# Patient Record
Sex: Female | Born: 1970 | Race: White | Hispanic: No | Marital: Married | Smoking: Former smoker
Health system: Southern US, Community
[De-identification: ages and names within clinical notes are randomized; demographics above are authoritative.]

## PROBLEM LIST (undated history)

## (undated) DIAGNOSIS — E079 Disorder of thyroid, unspecified: Secondary | ICD-10-CM

## (undated) DIAGNOSIS — F419 Anxiety disorder, unspecified: Secondary | ICD-10-CM

## (undated) DIAGNOSIS — F329 Major depressive disorder, single episode, unspecified: Secondary | ICD-10-CM

## (undated) DIAGNOSIS — B001 Herpesviral vesicular dermatitis: Secondary | ICD-10-CM

## (undated) DIAGNOSIS — F32A Depression, unspecified: Secondary | ICD-10-CM

## (undated) HISTORY — DX: Disorder of thyroid, unspecified: E07.9

## (undated) HISTORY — DX: Depression, unspecified: F32.A

## (undated) HISTORY — DX: Anxiety disorder, unspecified: F41.9

## (undated) HISTORY — DX: Major depressive disorder, single episode, unspecified: F32.9

## (undated) HISTORY — DX: Herpesviral vesicular dermatitis: B00.1

---

## 2004-04-14 ENCOUNTER — Emergency Department (HOSPITAL_COMMUNITY): Admission: EM | Admit: 2004-04-14 | Discharge: 2004-04-14 | Payer: Self-pay | Admitting: Emergency Medicine

## 2013-01-17 ENCOUNTER — Other Ambulatory Visit: Payer: Self-pay | Admitting: Nurse Practitioner

## 2013-01-18 NOTE — Telephone Encounter (Signed)
LAST OV 4/13

## 2013-02-22 ENCOUNTER — Other Ambulatory Visit: Payer: Self-pay | Admitting: Nurse Practitioner

## 2013-02-24 ENCOUNTER — Encounter: Payer: Self-pay | Admitting: Nurse Practitioner

## 2013-02-24 ENCOUNTER — Ambulatory Visit (INDEPENDENT_AMBULATORY_CARE_PROVIDER_SITE_OTHER): Payer: Managed Care, Other (non HMO) | Admitting: Nurse Practitioner

## 2013-02-24 VITALS — BP 115/75 | HR 76 | Temp 97.7°F | Ht 65.0 in | Wt 130.0 lb

## 2013-02-24 DIAGNOSIS — F329 Major depressive disorder, single episode, unspecified: Secondary | ICD-10-CM

## 2013-02-24 DIAGNOSIS — F32A Depression, unspecified: Secondary | ICD-10-CM | POA: Insufficient documentation

## 2013-02-24 DIAGNOSIS — B009 Herpesviral infection, unspecified: Secondary | ICD-10-CM | POA: Insufficient documentation

## 2013-02-24 MED ORDER — SERTRALINE HCL 50 MG PO TABS: 50.0000 mg | ORAL_TABLET | Freq: Every day | ORAL | Status: DC

## 2013-02-24 MED ORDER — VALACYCLOVIR HCL 1 G PO TABS: ORAL_TABLET | ORAL | Status: DC

## 2013-02-24 NOTE — Progress Notes (Signed)
  Subjective:    Patient ID: Shari Knox, female    DOB: 1971/05/12, 42 y.o.   MRN: [id number]  HPI Patient here today for follow-up of: 1. Depression- Currently on Zoloft 50mg  1 po qd- Patient says she is doing well- Can tell when she forgets to take meds. Patient says it calm she nerves and keeps her from getting overly anxious. 2. HSv1- Patient says that she gets fever blisters at least monthly- Valtrex relives them quickly.    Review of Systems  All other systems reviewed and are negative.       Objective:   Physical Exam  Constitutional: She is oriented to person, place, and time. She appears well-developed and well-nourished.  HENT:  Nose: Nose normal.  Mouth/Throat: Oropharynx is clear and moist.  Eyes: EOM are normal.  Neck: Trachea normal, normal range of motion and full passive range of motion without pain. Neck supple. No JVD present. Carotid bruit is not present. No thyromegaly present.  Cardiovascular: Normal rate, regular rhythm, normal heart sounds and intact distal pulses.  Exam reveals no gallop and no friction rub.   No murmur heard. Pulmonary/Chest: Effort normal and breath sounds normal.  Abdominal: Soft. Bowel sounds are normal. She exhibits no distension and no mass. There is no tenderness.  Musculoskeletal: Normal range of motion.  Lymphadenopathy:    She has no cervical adenopathy.  Neurological: She is alert and oriented to person, place, and time. She has normal reflexes.  Skin: Skin is warm and dry.  Psychiatric: She has a normal mood and affect. Her behavior is normal. Judgment and thought content normal.    BP 115/75  Pulse 76  Temp(Src) 97.7 F (36.5 C) (Oral)  Ht 5\' 5"  (1.651 m)  Wt 130 lb (58.968 kg)  BMI 21.63 kg/m2  LMP 02/21/2013       Assessment & Plan:  1. Depression Stress management Exercise - sertraline (ZOLOFT) 50 MG tablet; Take 1 tablet (50 mg total) by mouth daily.  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  Dispense: 12 tablet; Refill: 1  Mary-Margaret Daphine Deutscher, FNP

## 2013-05-27 ENCOUNTER — Ambulatory Visit: Payer: Managed Care, Other (non HMO) | Admitting: Nurse Practitioner

## 2013-09-08 ENCOUNTER — Other Ambulatory Visit: Payer: Self-pay | Admitting: Nurse Practitioner

## 2013-11-16 ENCOUNTER — Other Ambulatory Visit: Payer: Self-pay | Admitting: Nurse Practitioner

## 2013-11-17 NOTE — Telephone Encounter (Signed)
Last seen 02/24/13

## 2013-12-13 ENCOUNTER — Encounter: Payer: Self-pay | Admitting: Nurse Practitioner

## 2013-12-13 ENCOUNTER — Ambulatory Visit (INDEPENDENT_AMBULATORY_CARE_PROVIDER_SITE_OTHER): Payer: Managed Care, Other (non HMO) | Admitting: Nurse Practitioner

## 2013-12-13 VITALS — BP 121/78 | HR 94 | Temp 96.9°F | Ht 65.0 in | Wt 132.0 lb

## 2013-12-13 DIAGNOSIS — Z0000 Encounter for general adult medical examination without abnormal findings: Secondary | ICD-10-CM

## 2013-12-13 DIAGNOSIS — Z124 Encounter for screening for malignant neoplasm of cervix: Secondary | ICD-10-CM

## 2013-12-13 DIAGNOSIS — Z01419 Encounter for gynecological examination (general) (routine) without abnormal findings: Secondary | ICD-10-CM

## 2013-12-13 MED ORDER — SERTRALINE HCL 50 MG PO TABS: ORAL_TABLET | ORAL | Status: DC

## 2013-12-13 MED ORDER — VALACYCLOVIR HCL 1 G PO TABS: ORAL_TABLET | ORAL | Status: DC

## 2013-12-13 MED ORDER — CLOTRIMAZOLE-BETAMETHASONE 1-0.05 % EX CREA: 1.0000 "application " | TOPICAL_CREAM | Freq: Two times a day (BID) | CUTANEOUS | Status: DC

## 2013-12-13 NOTE — Patient Instructions (Signed)

## 2013-12-13 NOTE — Progress Notes (Signed)
Subjective:    Patient ID: Shari Knox, female    DOB: [birth year], 43 y.o.   MRN: [id number]  HPI Patient presents today for annual exam. States she is doing well and has no current complaints. Currently taking Zoloft 50 mg 1 tab PO daily. Has not had any recent episodes of anxiety. Also takes Valtrex when she feels blisters coming on. Occurs about once every 2 months. Patient does not exercise. Last dental exam was 12/14 and has not had a recent eye exam.   Rash Located on back of neck and began around January that itches at times. Had similar rash 3 years ago which was scraped and determined to be a yeast infection. Was given a cream to use which resolved rash. Patient has since ran out of the cream.    Review of Systems  Constitutional: Negative for fatigue.  Respiratory: Negative for shortness of breath.   Cardiovascular: Negative for chest pain.  Skin: Positive for rash (Neck).  Neurological: Negative for dizziness.  Psychiatric/Behavioral: Negative for sleep disturbance. The patient is not nervous/anxious.   All other systems reviewed and are negative.       Objective:   Physical Exam  Constitutional: She is oriented to person, place, and time. She appears well-developed and well-nourished.  HENT:  Head: Normocephalic.  Right Ear: Hearing, tympanic membrane, external ear and ear canal normal.  Left Ear: Hearing, tympanic membrane, external ear and ear canal normal.  Nose: Nose normal.  Mouth/Throat: Uvula is midline and oropharynx is clear and moist.  Eyes: Conjunctivae and EOM are normal. Pupils are equal, round, and reactive to light.  Neck: Normal range of motion and full passive range of motion without pain. Neck supple. No JVD present. Carotid bruit is not present. No mass and no thyromegaly present.  Cardiovascular: Normal rate, normal heart sounds and intact distal pulses.   No murmur heard. Pulmonary/Chest: Effort normal and breath sounds normal.  Abdominal: Soft.  Bowel sounds are normal. She exhibits no mass. There is no tenderness.  Genitourinary: Vagina normal and uterus normal. No breast swelling, tenderness, discharge or bleeding.  bimanual exam-No adnexal masses or tenderness.  LMP - 11/19/13  Musculoskeletal: Normal range of motion.  Lymphadenopathy:    She has no cervical adenopathy.  Neurological: She is alert and oriented to person, place, and time.  Skin: Skin is warm and dry. Rash noted.  Scaly, erythematous lesion with irregular boarders  Psychiatric: She has a normal mood and affect. Her behavior is normal. Judgment and thought content normal.   BP 121/78  Pulse 94  Temp(Src) 96.9 F (36.1 C) (Oral)  Ht _0  (1.651 m)  Wt 132 lb (59.875 kg)  BMI 21.97 kg/m2        Assessment & Plan:   1. Annual physical exam    Orders Placed This Encounter  Procedures  . CMP14+EGFR  . Lipid panel  . TSH  . CBC With differential/Platelet   Meds ordered this encounter  Medications  . clotrimazole-betamethasone (LOTRISONE) cream    Sig: Apply 1 application topically 2 (two) times daily.    Dispense:  45 g    Refill:  1    Order Specific Question:  Supervising Provider    Answer:  Chipper Herb [[id number]]  . sertraline (ZOLOFT) 50 MG tablet    Sig: TAKE 1 TABLET DAILY    Dispense:  30 tablet    Refill:  5    Order Specific Question:  Supervising Provider  Answer:  Chipper Herb [[id number]]  . valACYclovir (VALTREX) 1000 MG tablet    Sig: TAKE 2 TABLETS TWICE DAILY AT ONSET OF FEVER BLISTER    Dispense:  12 tablet    Refill:  1    Order Specific Question:  Supervising Provider    Answer:  Joycelyn Man   Mammogram to be scheduled by patient Diet and exercise encouraged Health maintenance reviewed Labs pending F/U in 1 year  Mary-Margaret Hassell Done, FNP

## 2013-12-14 ENCOUNTER — Other Ambulatory Visit: Payer: Self-pay | Admitting: Nurse Practitioner

## 2013-12-14 LAB — CBC WITH DIFFERENTIAL
BASOS ABS: 0 10*3/uL (ref 0.0–0.2)
BASOS: 1 %
EOS ABS: 0.3 10*3/uL (ref 0.0–0.4)
Eos: 5 %
HEMATOCRIT: 38.5 % (ref 34.0–46.6)
Hemoglobin: 12.5 g/dL (ref 11.1–15.9)
IMMATURE GRANULOCYTES: 0 %
Immature Grans (Abs): 0 10*3/uL (ref 0.0–0.1)
Lymphocytes Absolute: 1.3 10*3/uL (ref 0.7–3.1)
Lymphs: 19 %
MCH: 29.1 pg (ref 26.6–33.0)
MCHC: 32.5 g/dL (ref 31.5–35.7)
MCV: 90 fL (ref 79–97)
MONOCYTES: 7 %
MONOS ABS: 0.5 10*3/uL (ref 0.1–0.9)
NEUTROS ABS: 5 10*3/uL (ref 1.4–7.0)
NEUTROS PCT: 68 %
Platelets: 272 10*3/uL (ref 150–379)
RBC: 4.29 x10E6/uL (ref 3.77–5.28)
RDW: 13.7 % (ref 12.3–15.4)
WBC: 7.2 10*3/uL (ref 3.4–10.8)

## 2013-12-14 LAB — CMP14+EGFR
A/G RATIO: 1.6 (ref 1.1–2.5)
ALBUMIN: 4.5 g/dL (ref 3.5–5.5)
ALT: 11 IU/L (ref 0–32)
AST: 20 IU/L (ref 0–40)
Alkaline Phosphatase: 58 IU/L (ref 39–117)
BILIRUBIN TOTAL: 0.3 mg/dL (ref 0.0–1.2)
BUN / CREAT RATIO: 20 (ref 9–23)
BUN: 13 mg/dL (ref 6–24)
CALCIUM: 9.2 mg/dL (ref 8.7–10.2)
CHLORIDE: 100 mmol/L (ref 97–108)
CO2: 25 mmol/L (ref 18–29)
CREATININE: 0.64 mg/dL (ref 0.57–1.00)
GFR calc Af Amer: 127 mL/min/{1.73_m2} (ref >59)
GFR, EST NON AFRICAN AMERICAN: 110 mL/min/{1.73_m2} (ref >59)
GLOBULIN, TOTAL: 2.8 g/dL (ref 1.5–4.5)
Glucose: 85 mg/dL (ref 65–99)
POTASSIUM: 4.3 mmol/L (ref 3.5–5.2)
SODIUM: 139 mmol/L (ref 134–144)
TOTAL PROTEIN: 7.3 g/dL (ref 6.0–8.5)

## 2013-12-14 LAB — LIPID PANEL
CHOL/HDL RATIO: 2.8 ratio (ref 0.0–4.4)
Cholesterol, Total: 146 mg/dL (ref 100–199)
HDL: 53 mg/dL (ref >39)
LDL Calculated: 79 mg/dL (ref 0–99)
Triglycerides: 68 mg/dL (ref 0–149)
VLDL Cholesterol Cal: 14 mg/dL (ref 5–40)

## 2013-12-14 LAB — TSH: TSH: 5.7 u[IU]/mL — AB (ref 0.450–4.500)

## 2013-12-14 MED ORDER — LEVOTHYROXINE SODIUM 50 MCG PO TABS: 50.0000 ug | ORAL_TABLET | Freq: Every day | ORAL | Status: DC

## 2013-12-16 ENCOUNTER — Telehealth: Payer: Self-pay | Admitting: Nurse Practitioner

## 2013-12-16 LAB — PAP IG W/ RFLX HPV ASCU: PAP Smear Comment: 0

## 2013-12-16 LAB — HPV DNA PROBE HIGH RISK, AMPLIFIED: HPV, high-risk: NEGATIVE

## 2013-12-16 NOTE — Telephone Encounter (Signed)
Spoke with patient and given lab result and told patient we will readdress her pap

## 2013-12-16 NOTE — Telephone Encounter (Signed)
Discussed lab results and recommendations.

## 2013-12-19 ENCOUNTER — Telehealth: Payer: Self-pay | Admitting: Nurse Practitioner

## 2013-12-19 NOTE — Telephone Encounter (Signed)
Patient is very concerned because she has never had thyroid problems. And her thyroid was high and the thyroid med you sent in is to raise the leveal per patient she is just very confused because she cant get a straight answer.  Please call patient and explain to her what is going on.

## 2013-12-19 NOTE — Telephone Encounter (Signed)
Contacted patient and explained TSH levels and hypothyroidism

## 2014-03-22 ENCOUNTER — Other Ambulatory Visit: Payer: Self-pay | Admitting: Nurse Practitioner

## 2014-04-21 ENCOUNTER — Other Ambulatory Visit: Payer: Self-pay | Admitting: Nurse Practitioner

## 2014-05-23 ENCOUNTER — Other Ambulatory Visit: Payer: Self-pay | Admitting: Nurse Practitioner

## 2014-05-25 NOTE — Telephone Encounter (Signed)
no more refills without being seen  

## 2014-05-25 NOTE — Telephone Encounter (Signed)
Patient last seen on 12/13/13. Was to return in 6 weeks for repeat labs. Please advise on refill

## 2014-06-13 ENCOUNTER — Telehealth: Payer: Self-pay | Admitting: Nurse Practitioner

## 2014-06-13 NOTE — Telephone Encounter (Signed)
Appt given for next week per patient request

## 2014-06-21 ENCOUNTER — Telehealth: Payer: Self-pay | Admitting: *Deleted

## 2014-06-21 ENCOUNTER — Encounter: Payer: Self-pay | Admitting: Nurse Practitioner

## 2014-06-21 ENCOUNTER — Ambulatory Visit (INDEPENDENT_AMBULATORY_CARE_PROVIDER_SITE_OTHER): Payer: Managed Care, Other (non HMO) | Admitting: Nurse Practitioner

## 2014-06-21 VITALS — BP 107/67 | HR 72 | Temp 97.5°F | Ht 65.5 in | Wt 132.6 lb

## 2014-06-21 DIAGNOSIS — E034 Atrophy of thyroid (acquired): Secondary | ICD-10-CM

## 2014-06-21 DIAGNOSIS — E039 Hypothyroidism, unspecified: Secondary | ICD-10-CM | POA: Insufficient documentation

## 2014-06-21 DIAGNOSIS — F32A Depression, unspecified: Secondary | ICD-10-CM

## 2014-06-21 DIAGNOSIS — F3289 Other specified depressive episodes: Secondary | ICD-10-CM

## 2014-06-21 DIAGNOSIS — E038 Other specified hypothyroidism: Secondary | ICD-10-CM

## 2014-06-21 DIAGNOSIS — E0789 Other specified disorders of thyroid: Secondary | ICD-10-CM

## 2014-06-21 DIAGNOSIS — F329 Major depressive disorder, single episode, unspecified: Secondary | ICD-10-CM

## 2014-06-21 DIAGNOSIS — B009 Herpesviral infection, unspecified: Secondary | ICD-10-CM

## 2014-06-21 MED ORDER — VALACYCLOVIR HCL 1 G PO TABS: ORAL_TABLET | ORAL | Status: DC

## 2014-06-21 MED ORDER — SERTRALINE HCL 50 MG PO TABS: ORAL_TABLET | ORAL | Status: DC

## 2014-06-21 MED ORDER — LEVOTHYROXINE SODIUM 50 MCG PO TABS: ORAL_TABLET | ORAL | Status: DC

## 2014-06-21 NOTE — Progress Notes (Signed)
   Subjective:    Patient ID: Shari Knox, female    DOB: [birth year], 43 y.o.   MRN: [id number]  HPI Patient is in today for follow up of chronic medical problems. - Depression Currenty on zoloft- doing well without side effects -Hypothyroidism Levothyroxine 15mcg - no c/o fatigue- she says that she is feeling well. -HSV Fever blisters- takes valtrex only when sh efeels one coming on.  Review of Systems  Constitutional: Negative.   HENT: Negative.   Respiratory: Negative.   Cardiovascular: Negative.   Genitourinary: Negative.   Neurological: Negative.   Psychiatric/Behavioral: Negative.   All other systems reviewed and are negative.      Objective:   Physical Exam  Constitutional: She is oriented to person, place, and time. She appears well-developed and well-nourished.  HENT:  Nose: Nose normal.  Mouth/Throat: Oropharynx is clear and moist.  Eyes: EOM are normal.  Neck: Trachea normal, normal range of motion and full passive range of motion without pain. Neck supple. No JVD present. Carotid bruit is not present. No thyromegaly present.  Cardiovascular: Normal rate, regular rhythm, normal heart sounds and intact distal pulses.  Exam reveals no gallop and no friction rub.   No murmur heard. Pulmonary/Chest: Effort normal and breath sounds normal.  Abdominal: Soft. Bowel sounds are normal. She exhibits no distension and no mass. There is no tenderness.  Musculoskeletal: Normal range of motion.  Lymphadenopathy:    She has no cervical adenopathy.  Neurological: She is alert and oriented to person, place, and time. She has normal reflexes.  Skin: Skin is warm and dry.  Psychiatric: She has a normal mood and affect. Her behavior is normal. Judgment and thought content normal.   BP 107/67  Pulse 72  Temp(Src) 97.5 F (36.4 C) (Oral)  Ht 5' 5.5" (1.664 m)  Wt 132 lb 9.6 oz (60.147 kg)  BMI 21.72 kg/m2        Assessment & Plan:  1. Hypothyroidism due to acquired atrophy  of thyroid - levothyroxine (SYNTHROID, LEVOTHROID) 50 MCG tablet; Take 1 tablet (50 mcg total) by mouth daily before breakfast.  Dispense: 30 tablet; Refill: 5 - CMP14+EGFR - NMR, lipoprofile - Thyroid Panel With TSH  2. HSV-1 (herpes simplex virus 1) infection  - valACYclovir (VALTREX) 1000 MG tablet; TAKE 2 TABLETS TWICE DAILY AT ONSET OF FEVER BLISTER  Dispense: 12 tablet; Refill: 3  3. Depression Stress management - sertraline (ZOLOFT) 50 MG tablet; TAKE 1 TABLET DAILY  Dispense: 30 tablet; Refill: 5  Reviewed health maintenance Diet and exercise encouraged Follow up in 3 moths  Mary-Margaret Hassell Done, FNP

## 2014-06-21 NOTE — Telephone Encounter (Signed)
Called patient to come in on 06-22-14 to have blood work drawn since she didn't get it with appointment.

## 2014-06-21 NOTE — Patient Instructions (Signed)

## 2014-06-22 ENCOUNTER — Other Ambulatory Visit: Payer: Managed Care, Other (non HMO)

## 2014-06-22 NOTE — Progress Notes (Signed)
LAB ONLY 

## 2014-06-23 LAB — CMP14+EGFR
A/G RATIO: 1.6 (ref 1.1–2.5)
ALBUMIN: 4.4 g/dL (ref 3.5–5.5)
ALT: 13 IU/L (ref 0–32)
AST: 18 IU/L (ref 0–40)
Alkaline Phosphatase: 66 IU/L (ref 39–117)
BILIRUBIN TOTAL: 0.2 mg/dL (ref 0.0–1.2)
BUN / CREAT RATIO: 11 (ref 9–23)
BUN: 8 mg/dL (ref 6–24)
CO2: 24 mmol/L (ref 18–29)
CREATININE: 0.7 mg/dL (ref 0.57–1.00)
Calcium: 9.2 mg/dL (ref 8.7–10.2)
Chloride: 101 mmol/L (ref 97–108)
GFR, EST AFRICAN AMERICAN: 123 mL/min/{1.73_m2} (ref >59)
GFR, EST NON AFRICAN AMERICAN: 106 mL/min/{1.73_m2} (ref >59)
GLOBULIN, TOTAL: 2.7 g/dL (ref 1.5–4.5)
Glucose: 92 mg/dL (ref 65–99)
POTASSIUM: 3.9 mmol/L (ref 3.5–5.2)
Sodium: 139 mmol/L (ref 134–144)
Total Protein: 7.1 g/dL (ref 6.0–8.5)

## 2014-06-23 LAB — NMR, LIPOPROFILE
Cholesterol: 146 mg/dL (ref 100–199)
HDL CHOLESTEROL BY NMR: 48 mg/dL (ref >39)
HDL Particle Number: 33.7 umol/L (ref >=30.5)
LDL Particle Number: 705 nmol/L (ref <1000)
LDL SIZE: 21.2 nm (ref >20.5)
LDLC SERPL CALC-MCNC: 79 mg/dL (ref 0–99)
LP-IR Score: 42 (ref <=45)
Small LDL Particle Number: <90 nmol/L (ref <=527)
Triglycerides by NMR: 97 mg/dL (ref 0–149)

## 2014-06-23 LAB — THYROID PANEL WITH TSH
Free Thyroxine Index: 2.2 (ref 1.2–4.9)
T3 UPTAKE RATIO: 30 % (ref 24–39)
T4 TOTAL: 7.2 ug/dL (ref 4.5–12.0)
TSH: 2.47 u[IU]/mL (ref 0.450–4.500)

## 2014-12-22 ENCOUNTER — Telehealth: Payer: Self-pay | Admitting: Nurse Practitioner

## 2014-12-22 NOTE — Telephone Encounter (Signed)
Pt given appt with MMM 3/21 at 9:45.

## 2014-12-25 ENCOUNTER — Ambulatory Visit (INDEPENDENT_AMBULATORY_CARE_PROVIDER_SITE_OTHER): Payer: Managed Care, Other (non HMO) | Admitting: Nurse Practitioner

## 2014-12-25 ENCOUNTER — Encounter: Payer: Self-pay | Admitting: Nurse Practitioner

## 2014-12-25 VITALS — BP 132/85 | HR 91 | Temp 97.4°F | Ht 65.0 in | Wt 130.0 lb

## 2014-12-25 DIAGNOSIS — B009 Herpesviral infection, unspecified: Secondary | ICD-10-CM | POA: Diagnosis not present

## 2014-12-25 DIAGNOSIS — F329 Major depressive disorder, single episode, unspecified: Secondary | ICD-10-CM | POA: Diagnosis not present

## 2014-12-25 DIAGNOSIS — E034 Atrophy of thyroid (acquired): Secondary | ICD-10-CM

## 2014-12-25 DIAGNOSIS — E038 Other specified hypothyroidism: Secondary | ICD-10-CM

## 2014-12-25 DIAGNOSIS — F32A Depression, unspecified: Secondary | ICD-10-CM

## 2014-12-25 MED ORDER — SERTRALINE HCL 100 MG PO TABS: 100.0000 mg | ORAL_TABLET | Freq: Every day | ORAL | Status: DC

## 2014-12-25 MED ORDER — LEVOTHYROXINE SODIUM 50 MCG PO TABS: ORAL_TABLET | ORAL | Status: DC

## 2014-12-25 MED ORDER — ALPRAZOLAM 0.25 MG PO TABS: 0.2500 mg | ORAL_TABLET | Freq: Two times a day (BID) | ORAL | Status: DC | PRN
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
## 2014-12-25 NOTE — Patient Instructions (Signed)
Generalized Anxiety Disorder Generalized anxiety disorder (GAD) is a mental disorder. It interferes with life functions, including relationships, work, and school. GAD is different from normal anxiety, which everyone experiences at some point in their lives in response to specific life events and activities. Normal anxiety actually helps us prepare for and get through these life events and activities. Normal anxiety goes away after the event or activity is over.  GAD causes anxiety that is not necessarily related to specific events or activities. It also causes excess anxiety in proportion to specific events or activities. The anxiety associated with GAD is also difficult to control. GAD can vary from mild to severe. People with severe GAD can have intense waves of anxiety with physical symptoms (panic attacks).  SYMPTOMS The anxiety and worry associated with GAD are difficult to control. This anxiety and worry are related to many life events and activities and also occur more days than not for 6 months or longer. People with GAD also have three or more of the following symptoms (one or more in children):  Restlessness.   Fatigue.  Difficulty concentrating.   Irritability.  Muscle tension.  Difficulty sleeping or unsatisfying sleep. DIAGNOSIS GAD is diagnosed through an assessment by your health care provider. Your health care provider will ask you questions aboutyour mood,physical symptoms, and events in your life. Your health care provider may ask you about your medical history and use of alcohol or drugs, including prescription medicines. Your health care provider may also do a physical exam and blood tests. Certain medical conditions and the use of certain substances can cause symptoms similar to those associated with GAD. Your health care provider may refer you to a mental health specialist for further evaluation. TREATMENT The following therapies are usually used to treat GAD:    Medication. Antidepressant medication usually is prescribed for long-term daily control. Antianxiety medicines may be added in severe cases, especially when panic attacks occur.   Talk therapy (psychotherapy). Certain types of talk therapy can be helpful in treating GAD by providing support, education, and guidance. A form of talk therapy called cognitive behavioral therapy can teach you healthy ways to think about and react to daily life events and activities.  Stress managementtechniques. These include yoga, meditation, and exercise and can be very helpful when they are practiced regularly. A mental health specialist can help determine which treatment is best for you. Some people see improvement with one therapy. However, other people require a combination of therapies. Document Released: 01/17/2013 Document Revised: 02/06/2014 Document Reviewed: 01/17/2013 Exit[employer] Patient Information 2015 [employer]. This information is not intended to replace advice given to you by your health care provider. Make sure you discuss any questions you have with your health care provider.  

## 2014-12-26 LAB — THYROID PANEL WITH TSH
FREE THYROXINE INDEX: 2.1 (ref 1.2–4.9)
T3 UPTAKE RATIO: 25 % (ref 24–39)
T4, Total: 8.2 ug/dL (ref 4.5–12.0)
TSH: 2.62 u[IU]/mL (ref 0.450–4.500)

## 2014-12-26 LAB — CMP14+EGFR
A/G RATIO: 1.5 (ref 1.1–2.5)
ALBUMIN: 4.6 g/dL (ref 3.5–5.5)
ALK PHOS: 71 IU/L (ref 39–117)
ALT: 13 IU/L (ref 0–32)
AST: 19 IU/L (ref 0–40)
BILIRUBIN TOTAL: 0.3 mg/dL (ref 0.0–1.2)
BUN/Creatinine Ratio: 9 (ref 9–23)
BUN: 5 mg/dL — AB (ref 6–24)
CO2: 26 mmol/L (ref 18–29)
CREATININE: 0.58 mg/dL (ref 0.57–1.00)
Calcium: 9.5 mg/dL (ref 8.7–10.2)
Chloride: 99 mmol/L (ref 97–108)
GFR calc non Af Amer: 113 mL/min/{1.73_m2} (ref >59)
GFR, EST AFRICAN AMERICAN: 131 mL/min/{1.73_m2} (ref >59)
GLUCOSE: 87 mg/dL (ref 65–99)
Globulin, Total: 3.1 g/dL (ref 1.5–4.5)
POTASSIUM: 4.4 mmol/L (ref 3.5–5.2)
Sodium: 140 mmol/L (ref 134–144)
TOTAL PROTEIN: 7.7 g/dL (ref 6.0–8.5)

## 2014-12-27 ENCOUNTER — Encounter: Payer: Self-pay | Admitting: Nurse Practitioner

## 2015-01-02 ENCOUNTER — Telehealth: Payer: Self-pay | Admitting: Nurse Practitioner

## 2015-01-03 NOTE — Telephone Encounter (Signed)
Patient notified that paperwork up front and ready to pick up

## 2015-01-18 ENCOUNTER — Encounter: Payer: Self-pay | Admitting: Nurse Practitioner

## 2015-01-18 ENCOUNTER — Ambulatory Visit (INDEPENDENT_AMBULATORY_CARE_PROVIDER_SITE_OTHER): Payer: Managed Care, Other (non HMO) | Admitting: Nurse Practitioner

## 2015-01-18 VITALS — BP 118/74 | HR 77 | Temp 97.0°F | Ht 65.0 in | Wt 130.0 lb

## 2015-01-18 DIAGNOSIS — F329 Major depressive disorder, single episode, unspecified: Secondary | ICD-10-CM

## 2015-01-18 DIAGNOSIS — F411 Generalized anxiety disorder: Secondary | ICD-10-CM | POA: Diagnosis not present

## 2015-01-18 DIAGNOSIS — F32A Depression, unspecified: Secondary | ICD-10-CM

## 2015-01-18 NOTE — Patient Instructions (Signed)
Stress and Stress Management Stress is a normal reaction to life events. It is what you feel when life demands more than you are used to or more than you can handle. Some stress can be useful. For example, the stress reaction can help you catch the last bus of the day, study for a test, or meet a deadline at work. But stress that occurs too often or for too long can cause problems. It can affect your emotional health and interfere with relationships and normal daily activities. Too much stress can weaken your immune system and increase your risk for physical illness. If you already have a medical problem, stress can make it worse. CAUSES  All sorts of life events may cause stress. An event that causes stress for one person may not be stressful for another person. Major life events commonly cause stress. These may be positive or negative. Examples include losing your job, moving into a new home, getting married, having a baby, or losing a loved one. Less obvious life events may also cause stress, especially if they occur day after day or in combination. Examples include working long hours, driving in traffic, caring for children, being in debt, or being in a difficult relationship. SIGNS AND SYMPTOMS Stress may cause emotional symptoms including, the following:  Anxiety. This is feeling worried, afraid, on edge, overwhelmed, or out of control.  Anger. This is feeling irritated or impatient.  Depression. This is feeling sad, down, helpless, or guilty.  Difficulty focusing, remembering, or making decisions. Stress may cause physical symptoms, including the following:   Aches and pains. These may affect your head, neck, back, stomach, or other areas of your body.  Tight muscles or clenched jaw.  Low energy or trouble sleeping. Stress may cause unhealthy behaviors, including the following:   Eating to feel better (overeating) or skipping meals.  Sleeping too little, too much, or both.  Working  too much or putting off tasks (procrastination).  Smoking, drinking alcohol, or using drugs to feel better. DIAGNOSIS  Stress is diagnosed through an assessment by your health care provider. Your health care provider will ask questions about your symptoms and any stressful life events.Your health care provider will also ask about your medical history and may order blood tests or other tests. Certain medical conditions and medicine can cause physical symptoms similar to stress. Mental illness can cause emotional symptoms and unhealthy behaviors similar to stress. Your health care provider may refer you to a mental health professional for further evaluation.  TREATMENT  Stress management is the recommended treatment for stress.The goals of stress management are reducing stressful life events and coping with stress in healthy ways.  Techniques for reducing stressful life events include the following:  Stress identification. Self-monitor for stress and identify what causes stress for you. These skills may help you to avoid some stressful events.  Time management. Set your priorities, keep a calendar of events, and learn to say "no." These tools can help you avoid making too many commitments. Techniques for coping with stress include the following:  Rethinking the problem. Try to think realistically about stressful events rather than ignoring them or overreacting. Try to find the positives in a stressful situation rather than focusing on the negatives.  Exercise. Physical exercise can release both physical and emotional tension. The key is to find a form of exercise you enjoy and do it regularly.  Relaxation techniques. These relax the body and mind. Examples include yoga, meditation, tai chi, biofeedback, deep  breathing, progressive muscle relaxation, listening to music, being out in nature, journaling, and other hobbies. Again, the key is to find one or more that you enjoy and can do  regularly.  Healthy lifestyle. Eat a balanced diet, get plenty of sleep, and do not smoke. Avoid using alcohol or drugs to relax.  Strong support network. Spend time with family, friends, or other people you enjoy being around.Express your feelings and talk things over with someone you trust. Counseling or talktherapy with a mental health professional may be helpful if you are having difficulty managing stress on your own. Medicine is typically not recommended for the treatment of stress.Talk to your health care provider if you think you need medicine for symptoms of stress. HOME CARE INSTRUCTIONS  Keep all follow-up visits as directed by your health care provider.  Take all medicines as directed by your health care provider. SEEK MEDICAL CARE IF:  Your symptoms get worse or you start having new symptoms.  You feel overwhelmed by your problems and can no longer manage them on your own. SEEK IMMEDIATE MEDICAL CARE IF:  You feel like hurting yourself or someone else. Document Released: 03/18/2001 Document Revised: 02/06/2014 Document Reviewed: 05/17/2013 Exit[employer] Patient Information 2015 [employer]. This information is not intended to replace advice given to you by your health care provider. Make sure you discuss any questions you have with your health care provider.  

## 2015-01-18 NOTE — Progress Notes (Signed)
   Subjective:    Patient ID: Shari CooleyKaren Knox, female    DOB: 17-Jun-1971, 44 y.o.   MRN: [id number]  HPI Patient in today for follow-up appointment- She was seen 12/25/14 with increasing anxiety- we increased her zoloft at that time and gave her low dose xanax to take on prn basis- SHe says she has only needed to take xanax on 2 occassions. She says she is doing much better without side effects from zoloft.    Review of Systems  Constitutional: Negative.   HENT: Negative.   Respiratory: Negative.   Cardiovascular: Negative.   Genitourinary: Negative.   Neurological: Negative.   Psychiatric/Behavioral: Negative.   All other systems reviewed and are negative.      Objective:   Physical Exam  Constitutional: She appears well-developed and well-nourished.  Cardiovascular: Normal rate, regular rhythm and normal heart sounds.   Pulmonary/Chest: Effort normal and breath sounds normal.  Skin: Skin is warm.  Psychiatric: She has a normal mood and affect. Her behavior is normal. Judgment and thought content normal.    BP 118/74 mmHg  Pulse 77  Temp(Src) 97 F (36.1 C) (Oral)  Ht 5\' 5"  (1.651 m)  Wt 130 lb (58.968 kg)  BMI 21.63 kg/m2       Assessment & Plan:   1. Depression   2. GAD (generalized anxiety disorder)    Continue current  meds Stress management RTO prn  Mary-Margaret Daphine DeutscherMartin, FNP

## 2015-01-25 ENCOUNTER — Telehealth: Payer: Self-pay | Admitting: Nurse Practitioner

## 2015-01-30 NOTE — Telephone Encounter (Signed)
Been trying to fax since 01/19/15, fax fails, pt aware and coming to pick up forms today

## 2015-02-28 ENCOUNTER — Telehealth: Payer: Self-pay | Admitting: Nurse Practitioner

## 2015-02-28 NOTE — Telephone Encounter (Signed)
Patient advised to bring her FMLA paper work by the office to fill out

## 2015-03-26 ENCOUNTER — Encounter (INDEPENDENT_AMBULATORY_CARE_PROVIDER_SITE_OTHER): Payer: Self-pay

## 2015-03-26 ENCOUNTER — Encounter: Payer: Self-pay | Admitting: Nurse Practitioner

## 2015-03-26 ENCOUNTER — Ambulatory Visit (INDEPENDENT_AMBULATORY_CARE_PROVIDER_SITE_OTHER): Payer: Managed Care, Other (non HMO) | Admitting: Nurse Practitioner

## 2015-03-26 VITALS — BP 106/68 | HR 85 | Temp 97.1°F | Ht 65.0 in | Wt 129.0 lb

## 2015-03-26 DIAGNOSIS — F32A Depression, unspecified: Secondary | ICD-10-CM

## 2015-03-26 DIAGNOSIS — F411 Generalized anxiety disorder: Secondary | ICD-10-CM

## 2015-03-26 DIAGNOSIS — F329 Major depressive disorder, single episode, unspecified: Secondary | ICD-10-CM

## 2015-03-26 MED ORDER — SERTRALINE HCL 100 MG PO TABS: 100.0000 mg | ORAL_TABLET | Freq: Every day | ORAL | Status: DC

## 2015-03-26 NOTE — Progress Notes (Signed)
   Subjective:    Patient ID: Shari Knox, female    DOB: Aug 16, 1971, 44 y.o.   MRN: [id number]  HPI Patient has been out of work for several months due to anxiety- she was started on zoloft and was given xanax- she is doing much better- has  Not needed xanax in the last several weeks. Zoloft is working well without complications or side effects. SHe is ready to go back to work and needs  Note to return without restrictions.    Review of Systems  Constitutional: Negative.   HENT: Negative.   Respiratory: Negative.   Cardiovascular: Negative.   Genitourinary: Negative.   Neurological: Negative.   Psychiatric/Behavioral: Negative.   All other systems reviewed and are negative.      Objective:   Physical Exam  Constitutional: She is oriented to person, place, and time. She appears well-developed and well-nourished.  Cardiovascular: Normal rate, regular rhythm and normal heart sounds.   Pulmonary/Chest: Effort normal and breath sounds normal.  Neurological: She is alert and oriented to person, place, and time.  Skin: Skin is warm.  Psychiatric: She has a normal mood and affect. Her behavior is normal. Judgment and thought content normal.    BP 106/68 mmHg  Pulse 85  Temp(Src) 97.1 F (36.2 C) (Oral)  Ht 5\' 5"  (1.651 m)  Wt 129 lb (58.514 kg)  BMI 21.47 kg/m2       Assessment & Plan:   1. GAD (generalized anxiety disorder)   2. Depression    Meds ordered this encounter  Medications  . sertraline (ZOLOFT) 100 MG tablet    Sig: Take 1 tablet (100 mg total) by mouth daily.    Dispense:  30 tablet    Refill:  5    Order Specific Question:  Supervising Provider    Answer:  Deborra Medina   Stress management Return to work without restrictions  Mary-Margaret Daphine Deutscher, FNP

## 2015-03-26 NOTE — Patient Instructions (Signed)
Stress and Stress Management Stress is a normal reaction to life events. It is what you feel when life demands more than you are used to or more than you can handle. Some stress can be useful. For example, the stress reaction can help you catch the last bus of the day, study for a test, or meet a deadline at work. But stress that occurs too often or for too long can cause problems. It can affect your emotional health and interfere with relationships and normal daily activities. Too much stress can weaken your immune system and increase your risk for physical illness. If you already have a medical problem, stress can make it worse. CAUSES  All sorts of life events may cause stress. An event that causes stress for one person may not be stressful for another person. Major life events commonly cause stress. These may be positive or negative. Examples include losing your job, moving into a new home, getting married, having a baby, or losing a loved one. Less obvious life events may also cause stress, especially if they occur day after day or in combination. Examples include working long hours, driving in traffic, caring for children, being in debt, or being in a difficult relationship. SIGNS AND SYMPTOMS Stress may cause emotional symptoms including, the following:  Anxiety. This is feeling worried, afraid, on edge, overwhelmed, or out of control.  Anger. This is feeling irritated or impatient.  Depression. This is feeling sad, down, helpless, or guilty.  Difficulty focusing, remembering, or making decisions. Stress may cause physical symptoms, including the following:   Aches and pains. These may affect your head, neck, back, stomach, or other areas of your body.  Tight muscles or clenched jaw.  Low energy or trouble sleeping. Stress may cause unhealthy behaviors, including the following:   Eating to feel better (overeating) or skipping meals.  Sleeping too little, too much, or both.  Working  too much or putting off tasks (procrastination).  Smoking, drinking alcohol, or using drugs to feel better. DIAGNOSIS  Stress is diagnosed through an assessment by your health care provider. Your health care provider will ask questions about your symptoms and any stressful life events.Your health care provider will also ask about your medical history and may order blood tests or other tests. Certain medical conditions and medicine can cause physical symptoms similar to stress. Mental illness can cause emotional symptoms and unhealthy behaviors similar to stress. Your health care provider may refer you to a mental health professional for further evaluation.  TREATMENT  Stress management is the recommended treatment for stress.The goals of stress management are reducing stressful life events and coping with stress in healthy ways.  Techniques for reducing stressful life events include the following:  Stress identification. Self-monitor for stress and identify what causes stress for you. These skills may help you to avoid some stressful events.  Time management. Set your priorities, keep a calendar of events, and learn to say "no." These tools can help you avoid making too many commitments. Techniques for coping with stress include the following:  Rethinking the problem. Try to think realistically about stressful events rather than ignoring them or overreacting. Try to find the positives in a stressful situation rather than focusing on the negatives.  Exercise. Physical exercise can release both physical and emotional tension. The key is to find a form of exercise you enjoy and do it regularly.  Relaxation techniques. These relax the body and mind. Examples include yoga, meditation, tai chi, biofeedback, deep  breathing, progressive muscle relaxation, listening to music, being out in nature, journaling, and other hobbies. Again, the key is to find one or more that you enjoy and can do  regularly.  Healthy lifestyle. Eat a balanced diet, get plenty of sleep, and do not smoke. Avoid using alcohol or drugs to relax.  Strong support network. Spend time with family, friends, or other people you enjoy being around.Express your feelings and talk things over with someone you trust. Counseling or talktherapy with a mental health professional may be helpful if you are having difficulty managing stress on your own. Medicine is typically not recommended for the treatment of stress.Talk to your health care provider if you think you need medicine for symptoms of stress. HOME CARE INSTRUCTIONS  Keep all follow-up visits as directed by your health care provider.  Take all medicines as directed by your health care provider. SEEK MEDICAL CARE IF:  Your symptoms get worse or you start having new symptoms.  You feel overwhelmed by your problems and can no longer manage them on your own. SEEK IMMEDIATE MEDICAL CARE IF:  You feel like hurting yourself or someone else. Document Released: 03/18/2001 Document Revised: 02/06/2014 Document Reviewed: 05/17/2013 Exit[employer] Patient Information 2015 [employer]. This information is not intended to replace advice given to you by your health care provider. Make sure you discuss any questions you have with your health care provider.  

## 2015-06-29 ENCOUNTER — Other Ambulatory Visit: Payer: Self-pay | Admitting: Nurse Practitioner

## 2015-10-11 ENCOUNTER — Encounter: Payer: Self-pay | Admitting: Family

## 2015-10-11 ENCOUNTER — Ambulatory Visit (INDEPENDENT_AMBULATORY_CARE_PROVIDER_SITE_OTHER): Payer: Managed Care, Other (non HMO) | Admitting: Family

## 2015-10-11 ENCOUNTER — Ambulatory Visit: Payer: Managed Care, Other (non HMO) | Admitting: Family

## 2015-10-11 VITALS — BP 123/78 | HR 81 | Temp 97.7°F | Ht 65.0 in | Wt 131.2 lb

## 2015-10-11 DIAGNOSIS — G43009 Migraine without aura, not intractable, without status migrainosus: Secondary | ICD-10-CM

## 2015-10-11 MED ORDER — SUMATRIPTAN SUCCINATE 25 MG PO TABS: 25.0000 mg | ORAL_TABLET | ORAL | Status: DC | PRN

## 2015-10-11 MED ORDER — KETOROLAC TROMETHAMINE 60 MG/2ML IM SOLN
60.0000 mg | Freq: Once | INTRAMUSCULAR | Status: AC
  Administered 2015-10-11: 60 mg via INTRAMUSCULAR

## 2015-10-11 MED ORDER — ONDANSETRON HCL 4 MG PO TABS: 4.0000 mg | ORAL_TABLET | Freq: Three times a day (TID) | ORAL | Status: DC | PRN

## 2015-10-11 NOTE — Patient Instructions (Signed)

## 2015-10-11 NOTE — Progress Notes (Signed)
Subjective:    Patient ID: Shari CooleyKaren Knox, female    DOB: 09-19-1971, 45 y.o.   MRN: [id number]  Headache  This is a new problem. The current episode started in the past 7 days. The problem occurs constantly. The problem has been waxing and waning. The pain is located in the left unilateral region. The pain radiates to the left neck. The quality of the pain is described as throbbing. The pain is at a severity of 9/10. The pain is moderate. Associated symptoms include blurred vision, dizziness, nausea, phonophobia and vomiting. Pertinent negatives include no ear pain, eye pain, eye redness, hearing loss, photophobia, sinus pressure, sore throat or tinnitus. Nothing aggravates the symptoms. She has tried acetaminophen, darkened room, cold packs, Excedrin and NSAIDs for the symptoms. The treatment provided mild relief. Her past medical history is significant for migraine headaches. There is no history of migraines in the family.      Review of Systems  Constitutional: Negative.   HENT: Negative for ear pain, hearing loss, sinus pressure, sore throat and tinnitus.   Eyes: Positive for blurred vision. Negative for photophobia, pain and redness.  Respiratory: Negative.  Negative for shortness of breath.   Cardiovascular: Negative.  Negative for palpitations.  Gastrointestinal: Positive for nausea and vomiting.  Endocrine: Negative.   Genitourinary: Negative.   Musculoskeletal: Negative.   Neurological: Positive for dizziness and headaches.  Hematological: Negative.   Psychiatric/Behavioral: Negative.   All other systems reviewed and are negative.      Objective:   Physical Exam  Constitutional: She is oriented to person, place, and time. She appears well-developed and well-nourished. No distress.  HENT:  Head: Normocephalic and atraumatic.  Right Ear: External ear normal.  Mouth/Throat: Oropharynx is clear and moist.  Eyes: Pupils are equal, round, and reactive to light.  Neck: Normal  range of motion. Neck supple. No thyromegaly present.  Cardiovascular: Normal rate, regular rhythm, normal heart sounds and intact distal pulses.   No murmur heard. Pulmonary/Chest: Effort normal and breath sounds normal. No respiratory distress. She has no wheezes.  Abdominal: Soft. Bowel sounds are normal. She exhibits no distension. There is no tenderness.  Musculoskeletal: Normal range of motion. She exhibits no edema or tenderness.  Neurological: She is alert and oriented to person, place, and time. She has normal reflexes. No cranial nerve deficit.  Skin: Skin is warm and dry.  Psychiatric: She has a normal mood and affect. Her behavior is normal. Judgment and thought content normal.  Vitals reviewed.     BP 123/78 mmHg  Pulse 81  Temp(Src) 97.7 F (36.5 C) (Oral)  Ht 5\' 5"  (1.651 m)  Wt 131 lb 3.2 oz (59.512 kg)  BMI 21.83 kg/m2  LMP 10/09/2014     Assessment & Plan:  1. Migraine without aura and without status migrainosus, not intractable -Get plenty of rest -Limit stress  -IF headache worsens or vision, speech, or gait changes go straight to ED -RTO in 1 week  - ketorolac (TORADOL) injection 60 mg; Inject 2 mLs (60 mg total) into the muscle once. - SUMAtriptan (IMITREX) 25 MG tablet; Take 1 tablet (25 mg total) by mouth every 2 (two) hours as needed for migraine. May repeat in 2 hours if headache persists or recurs.  Dispense: 10 tablet; Refill: 0 - ondansetron (ZOFRAN) 4 MG tablet; Take 1 tablet (4 mg total) by mouth every 8 (eight) hours as needed for nausea or vomiting.  Dispense: 20 tablet; Refill: 0  Jannifer Rodneyhristy Rajean Desantiago, FNP

## 2015-10-15 ENCOUNTER — Other Ambulatory Visit: Payer: Self-pay | Admitting: Family

## 2015-10-16 ENCOUNTER — Ambulatory Visit (HOSPITAL_COMMUNITY)
Admission: RE | Admit: 2015-10-16 | Discharge: 2015-10-16 | Disposition: A | Discharge status: Home / Self Care | Payer: Managed Care, Other (non HMO) | Source: Ambulatory Visit | Attending: Family | Admitting: Family

## 2015-10-16 ENCOUNTER — Ambulatory Visit (INDEPENDENT_AMBULATORY_CARE_PROVIDER_SITE_OTHER): Payer: Managed Care, Other (non HMO) | Admitting: Family

## 2015-10-16 ENCOUNTER — Encounter: Payer: Self-pay | Admitting: Family

## 2015-10-16 VITALS — BP 115/87 | HR 115 | Temp 97.3°F | Ht 65.0 in | Wt 130.6 lb

## 2015-10-16 DIAGNOSIS — R51 Headache: Secondary | ICD-10-CM

## 2015-10-16 DIAGNOSIS — R519 Headache, unspecified: Secondary | ICD-10-CM

## 2015-10-16 DIAGNOSIS — H538 Other visual disturbances: Secondary | ICD-10-CM | POA: Diagnosis not present

## 2015-10-16 NOTE — Patient Instructions (Signed)

## 2015-10-16 NOTE — Progress Notes (Signed)
   Subjective:    Patient ID: Shari Knox, female    DOB: 07/16/1971, 45 y.o.   MRN: [id number]  HPI Pt presents to the office today for a 1 week recheck headache for the last 10 days. PT states she continues to have a headache, vision changes, and neck pain. PT states she has constant achy 8 out 10 pain in the base of her skull. Pt states her vision is blurry and pt states she "feels heavy". PT was given Toradol, Imitrex, and Zofran. PT states the nausea is better with the zofran, but continues to be dizzy. PT states this headache is different than previous headaches.    Review of Systems  Constitutional: Negative.   HENT: Negative.   Eyes: Negative.   Respiratory: Negative.  Negative for shortness of breath.   Cardiovascular: Negative.  Negative for palpitations.  Gastrointestinal: Negative.   Endocrine: Negative.   Genitourinary: Negative.   Musculoskeletal: Negative.   Neurological: Negative.  Negative for headaches.  Hematological: Negative.   Psychiatric/Behavioral: Negative.   All other systems reviewed and are negative.      Objective:   Physical Exam  Constitutional: She is oriented to person, place, and time. She appears well-developed and well-nourished. No distress.  HENT:  Head: Normocephalic and atraumatic.  Eyes: Pupils are equal, round, and reactive to light.  Neck: Normal range of motion. Neck supple. No thyromegaly present.  Cardiovascular: Normal rate, regular rhythm, normal heart sounds and intact distal pulses.   No murmur heard. Pulmonary/Chest: Effort normal and breath sounds normal. No respiratory distress. She has no wheezes.  Abdominal: Soft. Bowel sounds are normal. She exhibits no distension. There is no tenderness.  Musculoskeletal: Normal range of motion. She exhibits no edema or tenderness.  Neurological: She is alert and oriented to person, place, and time. She has normal reflexes. No cranial nerve deficit.  Skin: Skin is warm and dry.    Psychiatric: She has a normal mood and affect. Her behavior is normal. Judgment and thought content normal.  Vitals reviewed.   Blood pressure 115/87, pulse 115, temperature 97.3 F (36.3 C), temperature source Oral, height _0  (1.651 m), weight 130 lb 9.6 oz (59.24 kg), last menstrual period 10/09/2014.       Assessment & Plan:  1. Acute intractable headache, unspecified headache type - CT Head Wo Contrast; Future - Anemia Profile B - CMP14+EGFR  2. Blurred vision - CT Head Wo Contrast; Future - Anemia Profile B - CMP14+EGFR  Labs pending CT scan pending If CT scan WNL will refer to neurologists    Evelina Dun, FNP

## 2015-10-17 ENCOUNTER — Other Ambulatory Visit: Payer: Self-pay | Admitting: Family

## 2015-10-17 LAB — ANEMIA PROFILE B
BASOS: 1 %
Basophils Absolute: 0 10*3/uL (ref 0.0–0.2)
EOS (ABSOLUTE): 0.3 10*3/uL (ref 0.0–0.4)
EOS: 5 %
FERRITIN: 15 ng/mL (ref 15–150)
Folate: 19.1 ng/mL (ref >3.0)
HEMATOCRIT: 36.9 % (ref 34.0–46.6)
HEMOGLOBIN: 12.1 g/dL (ref 11.1–15.9)
IMMATURE GRANS (ABS): 0 10*3/uL (ref 0.0–0.1)
IMMATURE GRANULOCYTES: 0 %
IRON SATURATION: 25 % (ref 15–55)
IRON: 86 ug/dL (ref 27–159)
LYMPHS: 20 %
Lymphocytes Absolute: 1.1 10*3/uL (ref 0.7–3.1)
MCH: 28.8 pg (ref 26.6–33.0)
MCHC: 32.8 g/dL (ref 31.5–35.7)
MCV: 88 fL (ref 79–97)
MONOCYTES: 8 %
MONOS ABS: 0.5 10*3/uL (ref 0.1–0.9)
NEUTROS PCT: 66 %
Neutrophils Absolute: 3.6 10*3/uL (ref 1.4–7.0)
Platelets: 346 10*3/uL (ref 150–379)
RBC: 4.2 x10E6/uL (ref 3.77–5.28)
RDW: 14.1 % (ref 12.3–15.4)
RETIC CT PCT: 0.8 % (ref 0.6–2.6)
TIBC: 350 ug/dL (ref 250–450)
UIBC: 264 ug/dL (ref 131–425)
Vitamin B-12: 568 pg/mL (ref 211–946)
WBC: 5.5 10*3/uL (ref 3.4–10.8)

## 2015-10-17 LAB — CMP14+EGFR
A/G RATIO: 1.5 (ref 1.1–2.5)
ALT: 12 IU/L (ref 0–32)
AST: 20 IU/L (ref 0–40)
Albumin: 4.2 g/dL (ref 3.5–5.5)
Alkaline Phosphatase: 58 IU/L (ref 39–117)
BUN/Creatinine Ratio: 15 (ref 9–23)
BUN: 8 mg/dL (ref 6–24)
Bilirubin Total: <0.2 mg/dL (ref 0.0–1.2)
CALCIUM: 8.8 mg/dL (ref 8.7–10.2)
CO2: 24 mmol/L (ref 18–29)
CREATININE: 0.55 mg/dL — AB (ref 0.57–1.00)
Chloride: 99 mmol/L (ref 96–106)
GFR calc Af Amer: 132 mL/min/{1.73_m2} (ref >59)
GFR, EST NON AFRICAN AMERICAN: 114 mL/min/{1.73_m2} (ref >59)
GLOBULIN, TOTAL: 2.8 g/dL (ref 1.5–4.5)
Glucose: 91 mg/dL (ref 65–99)
Potassium: 4 mmol/L (ref 3.5–5.2)
SODIUM: 140 mmol/L (ref 134–144)
TOTAL PROTEIN: 7 g/dL (ref 6.0–8.5)

## 2015-10-17 MED ORDER — TOPIRAMATE 25 MG PO TABS: 25.0000 mg | ORAL_TABLET | Freq: Two times a day (BID) | ORAL | Status: DC

## 2015-10-18 ENCOUNTER — Ambulatory Visit: Payer: Managed Care, Other (non HMO) | Admitting: Family

## 2015-10-18 ENCOUNTER — Telehealth: Payer: Self-pay | Admitting: Nurse Practitioner

## 2015-10-18 NOTE — Telephone Encounter (Signed)
Pt called

## 2015-10-29 ENCOUNTER — Ambulatory Visit: Payer: Managed Care, Other (non HMO) | Admitting: Family

## 2015-11-06 ENCOUNTER — Other Ambulatory Visit: Payer: Self-pay | Admitting: Nurse Practitioner

## 2015-12-31 ENCOUNTER — Other Ambulatory Visit: Payer: Self-pay | Admitting: Nurse Practitioner

## 2015-12-31 NOTE — Telephone Encounter (Signed)
Last seen 10/16/15 Shari BonitoChristy  Last thyroid level 12/25/14

## 2016-01-24 ENCOUNTER — Ambulatory Visit: Payer: Managed Care, Other (non HMO) | Admitting: Family

## 2016-01-29 ENCOUNTER — Encounter: Payer: Self-pay | Admitting: *Deleted

## 2016-01-29 ENCOUNTER — Ambulatory Visit (INDEPENDENT_AMBULATORY_CARE_PROVIDER_SITE_OTHER): Payer: Managed Care, Other (non HMO) | Admitting: Nurse Practitioner

## 2016-01-29 ENCOUNTER — Encounter: Payer: Self-pay | Admitting: Nurse Practitioner

## 2016-01-29 ENCOUNTER — Encounter (INDEPENDENT_AMBULATORY_CARE_PROVIDER_SITE_OTHER): Payer: Self-pay

## 2016-01-29 VITALS — BP 117/78 | HR 90 | Temp 97.4°F | Ht 65.0 in | Wt 130.0 lb

## 2016-01-29 DIAGNOSIS — F329 Major depressive disorder, single episode, unspecified: Secondary | ICD-10-CM

## 2016-01-29 DIAGNOSIS — E038 Other specified hypothyroidism: Secondary | ICD-10-CM

## 2016-01-29 DIAGNOSIS — F32A Depression, unspecified: Secondary | ICD-10-CM

## 2016-01-29 DIAGNOSIS — F411 Generalized anxiety disorder: Secondary | ICD-10-CM

## 2016-01-29 DIAGNOSIS — E034 Atrophy of thyroid (acquired): Secondary | ICD-10-CM | POA: Diagnosis not present

## 2016-01-29 DIAGNOSIS — B009 Herpesviral infection, unspecified: Secondary | ICD-10-CM | POA: Diagnosis not present

## 2016-01-29 MED ORDER — LEVOTHYROXINE SODIUM 50 MCG PO TABS: ORAL_TABLET | ORAL | Status: DC

## 2016-01-29 MED ORDER — VALACYCLOVIR HCL 1 G PO TABS: ORAL_TABLET | ORAL | Status: DC

## 2016-01-29 NOTE — Patient Instructions (Signed)
Stress and Stress Management Stress is a normal reaction to life events. It is what you feel when life demands more than you are used to or more than you can handle. Some stress can be useful. For example, the stress reaction can help you catch the last bus of the day, study for a test, or meet a deadline at work. But stress that occurs too often or for too long can cause problems. It can affect your emotional health and interfere with relationships and normal daily activities. Too much stress can weaken your immune system and increase your risk for physical illness. If you already have a medical problem, stress can make it worse. CAUSES  All sorts of life events may cause stress. An event that causes stress for one person may not be stressful for another person. Major life events commonly cause stress. These may be positive or negative. Examples include losing your job, moving into a new home, getting married, having a baby, or losing a loved one. Less obvious life events may also cause stress, especially if they occur day after day or in combination. Examples include working long hours, driving in traffic, caring for children, being in debt, or being in a difficult relationship. SIGNS AND SYMPTOMS Stress may cause emotional symptoms including, the following:  Anxiety. This is feeling worried, afraid, on edge, overwhelmed, or out of control.  Anger. This is feeling irritated or impatient.  Depression. This is feeling sad, down, helpless, or guilty.  Difficulty focusing, remembering, or making decisions. Stress may cause physical symptoms, including the following:   Aches and pains. These may affect your head, neck, back, stomach, or other areas of your body.  Tight muscles or clenched jaw.  Low energy or trouble sleeping. Stress may cause unhealthy behaviors, including the following:   Eating to feel better (overeating) or skipping meals.  Sleeping too little, too much, or both.  Working  too much or putting off tasks (procrastination).  Smoking, drinking alcohol, or using drugs to feel better. DIAGNOSIS  Stress is diagnosed through an assessment by your health care provider. Your health care provider will ask questions about your symptoms and any stressful life events.Your health care provider will also ask about your medical history and may order blood tests or other tests. Certain medical conditions and medicine can cause physical symptoms similar to stress. Mental illness can cause emotional symptoms and unhealthy behaviors similar to stress. Your health care provider may refer you to a mental health professional for further evaluation.  TREATMENT  Stress management is the recommended treatment for stress.The goals of stress management are reducing stressful life events and coping with stress in healthy ways.  Techniques for reducing stressful life events include the following:  Stress identification. Self-monitor for stress and identify what causes stress for you. These skills may help you to avoid some stressful events.  Time management. Set your priorities, keep a calendar of events, and learn to say "no." These tools can help you avoid making too many commitments. Techniques for coping with stress include the following:  Rethinking the problem. Try to think realistically about stressful events rather than ignoring them or overreacting. Try to find the positives in a stressful situation rather than focusing on the negatives.  Exercise. Physical exercise can release both physical and emotional tension. The key is to find a form of exercise you enjoy and do it regularly.  Relaxation techniques. These relax the body and mind. Examples include yoga, meditation, tai chi, biofeedback, deep  breathing, progressive muscle relaxation, listening to music, being out in nature, journaling, and other hobbies. Again, the key is to find one or more that you enjoy and can do  regularly.  Healthy lifestyle. Eat a balanced diet, get plenty of sleep, and do not smoke. Avoid using alcohol or drugs to relax.  Strong support network. Spend time with family, friends, or other people you enjoy being around.Express your feelings and talk things over with someone you trust. Counseling or talktherapy with a mental health professional may be helpful if you are having difficulty managing stress on your own. Medicine is typically not recommended for the treatment of stress.Talk to your health care provider if you think you need medicine for symptoms of stress. HOME CARE INSTRUCTIONS  Keep all follow-up visits as directed by your health care provider.  Take all medicines as directed by your health care provider. SEEK MEDICAL CARE IF:  Your symptoms get worse or you start having new symptoms.  You feel overwhelmed by your problems and can no longer manage them on your own. SEEK IMMEDIATE MEDICAL CARE IF:  You feel like hurting yourself or someone else.   This information is not intended to replace advice given to you by your health care provider. Make sure you discuss any questions you have with your health care provider.   Document Released: 03/18/2001 Document Revised: 10/13/2014 Document Reviewed: 05/17/2013 Elsevier Interactive Patient Education 2016 [employer] Inc[employer]  

## 2016-01-29 NOTE — Progress Notes (Signed)
   Subjective:    Patient ID: Shari Knox, female    DOB: 12/21/1970, 45 y.o.   MRN: [id number]  HPI Patient comes in today for follow up: - Hypothyroidism- currently on synthroid 76mg- working well - HSV1- valtrex as needed for fever blisters - Gad/depression- has weaned off meds- says she is doing well right now not on any meds.   Review of Systems  Constitutional: Negative.   HENT: Negative.   Respiratory: Negative.   Cardiovascular: Negative.   Genitourinary: Negative.   Neurological: Negative.   Psychiatric/Behavioral: Negative.   All other systems reviewed and are negative.      Objective:   Physical Exam  Constitutional: She is oriented to person, place, and time. She appears well-developed and well-nourished.  Cardiovascular: Normal rate, regular rhythm and normal heart sounds.   Pulmonary/Chest: Effort normal and breath sounds normal.  Neurological: She is alert and oriented to person, place, and time.  Skin: Skin is warm.  Psychiatric: She has a normal mood and affect. Her behavior is normal. Judgment and thought content normal.   BP 117/78 mmHg  Pulse 90  Temp(Src) 97.4 F (36.3 C) (Oral)  Ht '5\' 5"'$  (1.651 m)  Wt 130 lb (58.968 kg)  BMI 21.63 kg/m2        Assessment & Plan:  1. Hypothyroidism due to acquired atrophy of thyroid - Thyroid Panel With TSH - CMP14+EGFR - levothyroxine (SYNTHROID, LEVOTHROID) 50 MCG tablet; TAKE (1) TABLET DAILY BE- FORE BREAKFAST.  Dispense: 30 tablet; Refill: 6  2. Depression Stress management  3. HSV-1 (herpes simplex virus 1) infection - valACYclovir (VALTREX) 1000 MG tablet; TAKE 2 TABLETS TWICE DAILY AT ONSET OF FEVER BLISTER  Dispense: 12 tablet; Refill: 6  4. GAD (generalized anxiety disorder) Stress management    Labs pending Health maintenance reviewed Diet and exercise encouraged Continue all meds Follow up  In 6 months   MVolcano FNP

## 2016-01-30 LAB — CMP14+EGFR
ALBUMIN: 4.3 g/dL (ref 3.5–5.5)
ALK PHOS: 53 IU/L (ref 39–117)
ALT: 12 IU/L (ref 0–32)
AST: 21 IU/L (ref 0–40)
Albumin/Globulin Ratio: 1.4 (ref 1.2–2.2)
BUN / CREAT RATIO: 11 (ref 9–23)
BUN: 6 mg/dL (ref 6–24)
CHLORIDE: 100 mmol/L (ref 96–106)
CO2: 27 mmol/L (ref 18–29)
CREATININE: 0.55 mg/dL — AB (ref 0.57–1.00)
Calcium: 9 mg/dL (ref 8.7–10.2)
GFR calc Af Amer: 132 mL/min/{1.73_m2} (ref >59)
GFR calc non Af Amer: 114 mL/min/{1.73_m2} (ref >59)
GLUCOSE: 91 mg/dL (ref 65–99)
Globulin, Total: 3.1 g/dL (ref 1.5–4.5)
Potassium: 3.8 mmol/L (ref 3.5–5.2)
Sodium: 142 mmol/L (ref 134–144)
Total Protein: 7.4 g/dL (ref 6.0–8.5)

## 2016-01-30 LAB — THYROID PANEL WITH TSH
Free Thyroxine Index: 2 (ref 1.2–4.9)
T3 UPTAKE RATIO: 27 % (ref 24–39)
T4 TOTAL: 7.5 ug/dL (ref 4.5–12.0)
TSH: 4.29 u[IU]/mL (ref 0.450–4.500)

## 2016-08-06 HISTORY — PX: NOVASURE ABLATION: SHX5394

## 2016-08-30 ENCOUNTER — Other Ambulatory Visit: Payer: Self-pay | Admitting: Nurse Practitioner

## 2016-08-30 DIAGNOSIS — E034 Atrophy of thyroid (acquired): Secondary | ICD-10-CM

## 2016-10-30 ENCOUNTER — Encounter: Payer: Self-pay | Admitting: Family Medicine

## 2016-10-30 ENCOUNTER — Ambulatory Visit (INDEPENDENT_AMBULATORY_CARE_PROVIDER_SITE_OTHER): Payer: Managed Care, Other (non HMO) | Admitting: Family Medicine

## 2016-10-30 VITALS — BP 113/73 | HR 96 | Temp 98.0°F | Ht 65.0 in | Wt 120.6 lb

## 2016-10-30 DIAGNOSIS — M791 Myalgia, unspecified site: Secondary | ICD-10-CM

## 2016-10-30 DIAGNOSIS — R5383 Other fatigue: Secondary | ICD-10-CM | POA: Diagnosis not present

## 2016-10-30 NOTE — Progress Notes (Signed)
BP 113/73   Pulse 96   Temp 98 F (36.7 C) (Oral)   Ht _0  (1.651 m)   Wt 120 lb 9.6 oz (54.7 kg)   LMP 08/24/2016   BMI 20.07 kg/m    Subjective:    Patient ID: Shari Knox, female    DOB: June 03, 1971, 46 y.o.   MRN: [id number]  HPI: Shari Knox is a 46 y.o. female presenting on 10/30/2016 for Fatigue (feels like lymph nodes in different areas of ody are swollen & achey feels like bruises but theres not one there, wonders if thyroid has changed, last TSH was in April)   HPI Shari Knox is a 46 year old female presenting with fatigue, weakness, and multiple areas of muscle pain for the last 3 weeks.  She states that her symptoms have been gradually getting worse and she thinks they may be related to her thyroid.  She describes her fatigue as feeling like she is drained all the time and says she always either sleeps too much or not enough.  Her weakness is described as being all over and sometimes affects her ability and desire to lift 5-20 pound objects at work.  She is experiencing muscle pain at several localized points across her body and she describes the pain as feeling more like a bruises rather than generalized aches. She says she is most tender near both armpits, the back of her neck and from her right hamstring to the inside of the knee.  She also experiences pain on the front of her chest, her left hamstring and left knee.  She states her neck pain shoots up giving her a headache and also radiates down to her upper back and shoulders.  She also experiences some shooting pain into both hands, mostly along the pinky and thumb. She says her pain is worse after sitting or standing in the same position for prolonged periods of time and in the mornings when she gets out of bed.  She does admit that some of her stiffness improves after moving around for a couple minutes, but the bruise-like tenderness does not improve. She has been alternating ibuprofen and tylenol for her pain with minimal relief.      Other related symptoms that she has always had include sensitivity to cold temperatures, especially in her hands, and intermittent heart flutters.  She has a history of anxiety but denies any feelings of anxiety or depression recently.  She has a history of hypothyroidism for about 3 years for which she takes Levothyroxine.   Relevant past medical, surgical, family and social history reviewed and updated as indicated. Interim medical history since our last visit reviewed. Allergies and medications reviewed and updated.  Review of Systems  Constitutional: Positive for fatigue. Negative for diaphoresis, fever and unexpected weight change.  HENT: Negative for congestion, rhinorrhea, sore throat and trouble swallowing.   Respiratory: Negative for cough and shortness of breath.   Cardiovascular: Positive for palpitations. Negative for chest pain and leg swelling.  Gastrointestinal: Negative for abdominal pain, constipation, diarrhea, nausea and vomiting.  Endocrine: Positive for cold intolerance. Negative for heat intolerance.  Genitourinary: Negative for difficulty urinating, frequency and pelvic pain.  Musculoskeletal: Positive for arthralgias, back pain, myalgias and neck pain.  Skin: Negative for color change and rash.  Neurological: Positive for weakness and headaches.  Psychiatric/Behavioral: Positive for sleep disturbance.    Per HPI unless specifically indicated above  Social History   Social History  . Marital status: Married  Spouse name: N/A  . Number of children: N/A  . Years of education: N/A   Occupational History  . Not on file.   Social History Main Topics  . Smoking status: Former Smoker    Quit date: 10/06/1996  . Smokeless tobacco: Never Used  . Alcohol use No  . Drug use: No  . Sexual activity: Not on file   Other Topics Concern  . Not on file   Social History Narrative  . No narrative on file    Past Surgical History:  Procedure Laterality Date  .  [place] ABLATION  08/2016    Family History  Problem Relation Age of Onset  . Hypothyroidism Mother   . Cancer Mother     Breast  . Heart disease Maternal Grandmother     Heart Bypass surgery  . Cancer Maternal Grandfather     Lung    Allergies as of 10/30/2016   No Known Allergies     Medication List       Accurate as of 10/30/16 10:22 PM. Always use your most recent med list.          clotrimazole-betamethasone cream Commonly known as:  LOTRISONE Apply 1 application topically 2 (two) times daily.   levothyroxine 50 MCG tablet Commonly known as:  SYNTHROID, LEVOTHROID TAKE (1) TABLET DAILY BE- FORE BREAKFAST.   valACYclovir 1000 MG tablet Commonly known as:  VALTREX TAKE 2 TABLETS TWICE DAILY AT ONSET OF FEVER BLISTER          Objective:    BP 113/73   Pulse 96   Temp 98 F (36.7 C) (Oral)   Ht _0  (1.651 m)   Wt 120 lb 9.6 oz (54.7 kg)   LMP 08/24/2016   BMI 20.07 kg/m   Wt Readings from Last 3 Encounters:  10/30/16 120 lb 9.6 oz (54.7 kg)  01/29/16 130 lb (59 kg)  10/16/15 130 lb 9.6 oz (59.2 kg)    Physical Exam  Constitutional: She is oriented to person, place, and time. Vital signs are normal. She appears well-developed and well-nourished. She is cooperative.  Non-toxic appearance. No distress.  Eyes: Conjunctivae and EOM are normal. Pupils are equal, round, and reactive to light.  Neck: Normal range of motion. Muscular tenderness present. No spinous process tenderness present. Thyromegaly present.    Cardiovascular: Normal rate, regular rhythm and normal heart sounds.   Pulses:      Radial pulses are 2+ on the right side, and 2+ on the left side.  Pulmonary/Chest: Effort normal and breath sounds normal. She exhibits tenderness. She exhibits no bony tenderness. Right breast exhibits tenderness. Right breast exhibits no mass. Left breast exhibits tenderness. Left breast exhibits no mass.    Abdominal: Soft. Normal appearance. There is no  tenderness. There is no CVA tenderness.  Musculoskeletal:       Right shoulder: She exhibits tenderness and pain.       Left shoulder: She exhibits tenderness and pain.       Cervical back: She exhibits tenderness and pain. She exhibits no bony tenderness.       Thoracic back: She exhibits tenderness and pain. She exhibits no bony tenderness.       Right hand: She exhibits normal capillary refill.       Left hand: She exhibits normal capillary refill.       Right upper leg: She exhibits tenderness.       Left upper leg: She exhibits tenderness.  Legs: Lymphadenopathy:    She has cervical adenopathy.       Left cervical: Posterior cervical adenopathy present.  One left posterior cervical lymph node: small, firm, non-tender and mobile.  Has been there for years.  Neurological: She is alert and oriented to person, place, and time.  Reflex Scores:      Tricep reflexes are 2+ on the right side and 2+ on the left side.      Bicep reflexes are 2+ on the right side and 2+ on the left side.      Brachioradialis reflexes are 2+ on the right side and 2+ on the left side.      Patellar reflexes are 3+ on the right side and 3+ on the left side. Skin: Skin is warm and dry. No bruising and no rash noted. She is not diaphoretic. No cyanosis. Nails show no clubbing.  Psychiatric: She has a normal mood and affect. Her speech is normal and behavior is normal.      Assessment & Plan:   Problem List Items Addressed This Visit    None    Visit Diagnoses    Other fatigue    -  Primary   Relevant Orders   TSH (Completed)   CMP14+EGFR (Completed)   High sensitivity CRP (Completed)   Sedimentation rate (Completed)   ANA Comprehensive Panel (Completed)   CBC with Differential/Platelet (Completed)   Myalgia       Relevant Orders   TSH (Completed)   CMP14+EGFR (Completed)   High sensitivity CRP (Completed)   Sedimentation rate (Completed)   ANA Comprehensive Panel (Completed)   CBC with  Differential/Platelet (Completed)      Concern for possible fibromyalgia, will do extensive testing with laboratory values and discuss it again in the future.  Follow up plan: Return if symptoms worsen or fail to improve.   Caryl Pina, MD [facility] 10/31/2016, 10:27 AM

## 2016-10-31 ENCOUNTER — Other Ambulatory Visit: Payer: Self-pay | Admitting: *Deleted

## 2016-10-31 DIAGNOSIS — R899 Unspecified abnormal finding in specimens from other organs, systems and tissues: Secondary | ICD-10-CM

## 2016-10-31 LAB — CMP14+EGFR
A/G RATIO: 1.3 (ref 1.2–2.2)
ALK PHOS: 51 IU/L (ref 39–117)
ALT: 9 IU/L (ref 0–32)
AST: 16 IU/L (ref 0–40)
Albumin: 4.3 g/dL (ref 3.5–5.5)
BUN/Creatinine Ratio: 16 (ref 9–23)
BUN: 8 mg/dL (ref 6–24)
Bilirubin Total: 0.4 mg/dL (ref 0.0–1.2)
CALCIUM: 9.1 mg/dL (ref 8.7–10.2)
CO2: 27 mmol/L (ref 18–29)
CREATININE: 0.51 mg/dL — AB (ref 0.57–1.00)
Chloride: 101 mmol/L (ref 96–106)
GFR calc Af Amer: 134 mL/min/{1.73_m2} (ref >59)
GFR, EST NON AFRICAN AMERICAN: 116 mL/min/{1.73_m2} (ref >59)
Globulin, Total: 3.2 g/dL (ref 1.5–4.5)
Glucose: 93 mg/dL (ref 65–99)
POTASSIUM: 4.1 mmol/L (ref 3.5–5.2)
Sodium: 141 mmol/L (ref 134–144)
Total Protein: 7.5 g/dL (ref 6.0–8.5)

## 2016-10-31 LAB — CBC WITH DIFFERENTIAL/PLATELET
BASOS ABS: 0.1 10*3/uL (ref 0.0–0.2)
Basos: 1 %
EOS (ABSOLUTE): 0.3 10*3/uL (ref 0.0–0.4)
EOS: 5 %
HEMATOCRIT: 35.9 % (ref 34.0–46.6)
Hemoglobin: 12.2 g/dL (ref 11.1–15.9)
IMMATURE GRANULOCYTES: 0 %
Immature Grans (Abs): 0 10*3/uL (ref 0.0–0.1)
LYMPHS ABS: 1.5 10*3/uL (ref 0.7–3.1)
Lymphs: 26 %
MCH: 29.2 pg (ref 26.6–33.0)
MCHC: 34 g/dL (ref 31.5–35.7)
MCV: 86 fL (ref 79–97)
MONOS ABS: 0.6 10*3/uL (ref 0.1–0.9)
Monocytes: 10 %
Neutrophils Absolute: 3.5 10*3/uL (ref 1.4–7.0)
Neutrophils: 58 %
PLATELETS: 275 10*3/uL (ref 150–379)
RBC: 4.18 x10E6/uL (ref 3.77–5.28)
RDW: 13.8 % (ref 12.3–15.4)
WBC: 6 10*3/uL (ref 3.4–10.8)

## 2016-10-31 LAB — ANA COMPREHENSIVE PANEL
Anti JO-1: <0.2 AI (ref 0.0–0.9)
CHROMATIN AB SERPL-ACNC: 0.2 AI (ref 0.0–0.9)
ENA RNP Ab: <0.2 AI (ref 0.0–0.9)
ENA SSA (RO) Ab: <0.2 AI (ref 0.0–0.9)
Scleroderma SCL-70: <0.2 AI (ref 0.0–0.9)
dsDNA Ab: 15 IU/mL — ABNORMAL HIGH (ref 0–9)

## 2016-10-31 LAB — HIGH SENSITIVITY CRP: CRP, High Sensitivity: 0.38 mg/L (ref 0.00–3.00)

## 2016-10-31 LAB — SEDIMENTATION RATE: SED RATE: 2 mm/h (ref 0–32)

## 2016-10-31 LAB — TSH: TSH: 3.52 u[IU]/mL (ref 0.450–4.500)

## 2016-11-05 ENCOUNTER — Telehealth: Payer: Self-pay | Admitting: Family Medicine

## 2016-11-05 DIAGNOSIS — E049 Nontoxic goiter, unspecified: Secondary | ICD-10-CM

## 2016-11-05 NOTE — Telephone Encounter (Signed)
I think this order was artery placement, double check and if not place an ultrasound of her thyroid, diagnosis goiter

## 2016-11-05 NOTE — Telephone Encounter (Signed)
Left a message stating a referral was done for US of thyroid.   Please check back with referral here for updates on appointment.

## 2016-11-12 ENCOUNTER — Ambulatory Visit (HOSPITAL_COMMUNITY)
Admission: RE | Admit: 2016-11-12 | Discharge: 2016-11-12 | Disposition: A | Discharge status: Home / Self Care | Payer: Managed Care, Other (non HMO) | Source: Ambulatory Visit | Attending: Family Medicine | Admitting: Family Medicine

## 2016-11-12 ENCOUNTER — Telehealth: Payer: Self-pay | Admitting: Nurse Practitioner

## 2016-11-12 DIAGNOSIS — E049 Nontoxic goiter, unspecified: Secondary | ICD-10-CM

## 2016-11-12 NOTE — Telephone Encounter (Signed)
Pt notified of results Verbalizes understanding 

## 2016-12-11 ENCOUNTER — Encounter: Payer: Self-pay | Admitting: Family Medicine

## 2016-12-11 ENCOUNTER — Ambulatory Visit (INDEPENDENT_AMBULATORY_CARE_PROVIDER_SITE_OTHER): Payer: Managed Care, Other (non HMO) | Admitting: Family Medicine

## 2016-12-11 VITALS — BP 105/75 | HR 92 | Temp 98.6°F | Ht 65.0 in | Wt 120.4 lb

## 2016-12-11 DIAGNOSIS — M797 Fibromyalgia: Secondary | ICD-10-CM

## 2016-12-11 DIAGNOSIS — R5383 Other fatigue: Secondary | ICD-10-CM | POA: Diagnosis not present

## 2016-12-11 MED ORDER — DULOXETINE HCL 30 MG PO CPEP: 30.0000 mg | ORAL_CAPSULE | Freq: Every day | ORAL | 1 refills | Status: DC

## 2016-12-11 NOTE — Progress Notes (Signed)
BP 105/75   Pulse 92   Temp 98.6 F (37 C) (Oral)   Ht 5\' 5"  (1.651 m)   Wt 120 lb 6 oz (54.6 kg)   BMI 20.03 kg/m    Subjective:    Patient ID: Shari Knox, female    DOB: April 24, 1971, 46 y.o.   MRN: [id number]  HPI: Shari Knox is a 46 y.o. female presenting on 12/11/2016 for Discuss rheumatology visit (ANA was normal at visit, thyroid level was elevated, advised she follow up with PCP; still having fatigue, body aches, crying; worried because she had deer tick bite in August or September 2017)   HPI Diffuse muscle aches and joint pains Patient has been having diffuse muscle aches and joint pain that's been going on for the past 6 months. She cannot recall if she had a tick bite or not but would like to possibly to be tested for that. She saw a rheumatologist and he did not think it was related to a rheumatological illness. She has been taking her thyroid medication. Her levels here with [place] were good but then she went and saw the rheumatologist and he retested and said they were elevated so we will recheck it today because we do not have those records. She denies any fevers or chills or shortness of breath or chest pain or wheezing. She does have some anxiety and depression and fatigue and body aches and crying. She just feels like she cannot find the answering cannot get better from this.  Relevant past medical, surgical, family and social history reviewed and updated as indicated. Interim medical history since our last visit reviewed. Allergies and medications reviewed and updated.  Review of Systems  Constitutional: Positive for fatigue. Negative for chills and fever.  Respiratory: Negative for chest tightness and shortness of breath.   Cardiovascular: Negative for chest pain and leg swelling.  Musculoskeletal: Positive for arthralgias and myalgias. Negative for back pain and gait problem.  Skin: Negative for color change and rash.  Neurological: Negative for dizziness,  light-headedness and headaches.  Psychiatric/Behavioral: Positive for decreased concentration, dysphoric mood and sleep disturbance. Negative for agitation, behavioral problems, self-injury and suicidal ideas. The patient is nervous/anxious.   All other systems reviewed and are negative.   Per HPI unless specifically indicated above        Objective:    BP 105/75   Pulse 92   Temp 98.6 F (37 C) (Oral)   Ht 5\' 5"  (1.651 m)   Wt 120 lb 6 oz (54.6 kg)   BMI 20.03 kg/m   Wt Readings from Last 3 Encounters:  12/11/16 120 lb 6 oz (54.6 kg)  10/30/16 120 lb 9.6 oz (54.7 kg)  01/29/16 130 lb (59 kg)    Physical Exam  Constitutional: She is oriented to person, place, and time. She appears well-developed and well-nourished. No distress.  Eyes: Conjunctivae are normal.  Neck: Neck supple. No thyromegaly present.  Cardiovascular: Normal rate, regular rhythm, normal heart sounds and intact distal pulses.   No murmur heard. Pulmonary/Chest: Effort normal and breath sounds normal. No respiratory distress. She has no wheezes. She has no rales.  Musculoskeletal: Normal range of motion. She exhibits tenderness (Diffuse myalgias). She exhibits no edema.  Lymphadenopathy:    She has no cervical adenopathy.  Neurological: She is alert and oriented to person, place, and time. Coordination normal.  Skin: Skin is warm and dry. No rash noted. She is not diaphoretic.  Psychiatric: She has a normal mood and  affect. Her behavior is normal.  Nursing note and vitals reviewed.     Assessment & Plan:   Problem List Items Addressed This Visit    None    Visit Diagnoses    Other fatigue    -  Primary   Relevant Medications   DULoxetine (CYMBALTA) 30 MG capsule   Other Relevant Orders   TSH   Rocky mtn spotted fvr abs pnl(IgG+IgM)   Lyme Ab/Western Blot Reflex   Alpha-Gal Panel   Thyroid antibodies   Fibromyalgia       Relevant Medications   DULoxetine (CYMBALTA) 30 MG capsule        Follow up plan: Return in about 4 weeks (around 01/08/2017), or if symptoms worsen or fail to improve, for Fibromyalgia and anxiety recheck.  Counseling provided for all of the vaccine components Orders Placed This Encounter  Procedures  . TSH  . Rocky mtn spotted fvr abs pnl(IgG+IgM)  . Lyme Ab/Western Blot Reflex  . Alpha-Gal Panel  . Thyroid antibodies    Arville CareJoshua Rane Dumm, MD [facility] 12/11/2016, 4:57 PM

## 2016-12-15 LAB — ALPHA-GAL PANEL
Alpha Gal IgE*: <0.1 kU/L (ref <0.35)
BEEF CLASS INTERPRETATION: 0
Beef (Bos spp) IgE: <0.1 kU/L (ref <0.35)
Class Interpretation: 0
LAMB CLASS INTERPRETATION: 0
Pork (Sus spp) IgE: <0.1 kU/L (ref <0.35)

## 2016-12-15 LAB — THYROID ANTIBODIES
THYROID PEROXIDASE ANTIBODY: 554 [IU]/mL — AB (ref 0–34)
Thyroglobulin Antibody: 4.3 IU/mL — ABNORMAL HIGH (ref 0.0–0.9)

## 2016-12-15 LAB — LYME AB/WESTERN BLOT REFLEX
LYME DISEASE AB, QUANT, IGM: <0.8 index (ref 0.00–0.79)
Lyme IgG/IgM Ab: <0.91 {ISR} (ref 0.00–0.90)

## 2016-12-15 LAB — ROCKY MTN SPOTTED FVR ABS PNL(IGG+IGM)
RMSF IGG: NEGATIVE
RMSF IgM: 0.46 index (ref 0.00–0.89)

## 2016-12-15 LAB — TSH: TSH: 2.68 u[IU]/mL (ref 0.450–4.500)

## 2017-01-09 ENCOUNTER — Encounter: Payer: Self-pay | Admitting: Family Medicine

## 2017-01-09 ENCOUNTER — Ambulatory Visit (INDEPENDENT_AMBULATORY_CARE_PROVIDER_SITE_OTHER): Payer: Managed Care, Other (non HMO) | Admitting: Family Medicine

## 2017-01-09 DIAGNOSIS — R5383 Other fatigue: Secondary | ICD-10-CM | POA: Diagnosis not present

## 2017-01-09 DIAGNOSIS — M797 Fibromyalgia: Secondary | ICD-10-CM

## 2017-01-09 MED ORDER — DULOXETINE HCL 30 MG PO CPEP: 30.0000 mg | ORAL_CAPSULE | Freq: Every day | ORAL | 3 refills | Status: DC

## 2017-01-09 NOTE — Progress Notes (Signed)
BP 109/74   Pulse 72   Temp 97.9 F (36.6 C) (Oral)   Ht  (1.651 m)   Wt 121 lb (54.9 kg)   BMI 20.14 kg/m    Subjective:    Patient ID: Shari Knox, female    DOB: 02/27/1971, 46 y.o.   MRN: [id number]  HPI: Shari Knox is a 46 y.o. female presenting on 01/09/2017 for Fibromyalgia (Followup; patient reports she is doing much better) and Anxiety   HPI Fibromyalgia and anxiety recheck Patient is coming in for recheck of her fibromyalgia and muscle aches and anxiety. She started Cymbalta one month ago and feels like it's doing very well and her muscle aches are not completely gone but they are very infrequent and she is able to function on a day-to-day basis. She says her anxiety is also been reduced and she is feeling a lot better about life and she feels like she is very happy and can go about doing the things that she wants to do. She denies any suicidal ideations or thoughts of hurting herself. She is sleeping better at night as well because the muscle aches or not keeping her up at night.  Relevant past medical, surgical, family and social history reviewed and updated as indicated. Interim medical history since our last visit reviewed. Allergies and medications reviewed and updated.  Review of Systems  Constitutional: Negative for chills and fever.  Respiratory: Negative for chest tightness and shortness of breath.   Cardiovascular: Negative for chest pain and leg swelling.  Musculoskeletal: Positive for myalgias. Negative for back pain and gait problem.  Skin: Negative for rash.  Neurological: Negative for light-headedness and headaches.  Psychiatric/Behavioral: Negative for agitation, behavioral problems, decreased concentration, dysphoric mood, self-injury, sleep disturbance and suicidal ideas. The patient is nervous/anxious.   All other systems reviewed and are negative.   Per HPI unless specifically indicated above      Objective:    BP 109/74   Pulse 72   Temp  97.9 F (36.6 C) (Oral)   Ht  (1.651 m)   Wt 121 lb (54.9 kg)   BMI 20.14 kg/m   Wt Readings from Last 3 Encounters:  01/09/17 121 lb (54.9 kg)  12/11/16 120 lb 6 oz (54.6 kg)  10/30/16 120 lb 9.6 oz (54.7 kg)    Physical Exam  Constitutional: She is oriented to person, place, and time. She appears well-developed and well-nourished. No distress.  Eyes: Conjunctivae are normal.  Cardiovascular: Normal rate, regular rhythm, normal heart sounds and intact distal pulses.   No murmur heard. Pulmonary/Chest: Effort normal and breath sounds normal. No respiratory distress. She has no wheezes. She has no rales.  Musculoskeletal: Normal range of motion. She exhibits no edema or tenderness (No muscular tenderness on exam. No point tenderness).  Neurological: She is alert and oriented to person, place, and time. Coordination normal.  Skin: Skin is warm and dry. No rash noted. She is not diaphoretic.  Psychiatric: She has a normal mood and affect. Her behavior is normal.  Nursing note and vitals reviewed.     Assessment & Plan:   Problem List Items Addressed This Visit    None    Visit Diagnoses    Other fatigue       Relevant Medications   DULoxetine (CYMBALTA) 30 MG capsule   Fibromyalgia       Relevant Medications   DULoxetine (CYMBALTA) 30 MG capsule      Follow up plan: Return in  about 3 months (around 04/10/2017), or if symptoms worsen or fail to improve, for Recheck fibromyalgia and thyroid.  Counseling provided for all of the vaccine components No orders of the defined types were placed in this encounter.   Arville Care, MD [facility] 01/09/2017, 8:15 AM

## 2017-01-28 ENCOUNTER — Other Ambulatory Visit: Payer: Self-pay | Admitting: Nurse Practitioner

## 2017-01-28 DIAGNOSIS — E034 Atrophy of thyroid (acquired): Secondary | ICD-10-CM

## 2017-02-19 ENCOUNTER — Other Ambulatory Visit: Payer: Self-pay | Admitting: Family Medicine

## 2017-02-19 DIAGNOSIS — N6489 Other specified disorders of breast: Secondary | ICD-10-CM

## 2017-03-03 ENCOUNTER — Ambulatory Visit (HOSPITAL_COMMUNITY)
Admission: RE | Admit: 2017-03-03 | Discharge: 2017-03-03 | Disposition: A | Discharge status: Home / Self Care | Payer: Managed Care, Other (non HMO) | Source: Ambulatory Visit | Attending: Family Medicine | Admitting: Family Medicine

## 2017-03-03 DIAGNOSIS — N6489 Other specified disorders of breast: Secondary | ICD-10-CM | POA: Insufficient documentation

## 2017-03-03 DIAGNOSIS — N6002 Solitary cyst of left breast: Secondary | ICD-10-CM | POA: Diagnosis not present

## 2017-03-17 ENCOUNTER — Other Ambulatory Visit: Payer: Self-pay | Admitting: Nurse Practitioner

## 2017-03-17 DIAGNOSIS — B009 Herpesviral infection, unspecified: Secondary | ICD-10-CM

## 2017-06-05 ENCOUNTER — Other Ambulatory Visit: Payer: Self-pay | Admitting: Family Medicine

## 2017-06-05 DIAGNOSIS — R5383 Other fatigue: Secondary | ICD-10-CM

## 2017-06-05 DIAGNOSIS — M797 Fibromyalgia: Secondary | ICD-10-CM

## 2017-06-24 ENCOUNTER — Ambulatory Visit (INDEPENDENT_AMBULATORY_CARE_PROVIDER_SITE_OTHER): Payer: Managed Care, Other (non HMO) | Admitting: Family Medicine

## 2017-06-24 ENCOUNTER — Encounter: Payer: Self-pay | Admitting: Family Medicine

## 2017-06-24 VITALS — BP 106/75 | HR 91 | Temp 98.0°F | Ht 65.0 in | Wt 118.4 lb

## 2017-06-24 DIAGNOSIS — B009 Herpesviral infection, unspecified: Secondary | ICD-10-CM | POA: Diagnosis not present

## 2017-06-24 DIAGNOSIS — F3341 Major depressive disorder, recurrent, in partial remission: Secondary | ICD-10-CM | POA: Diagnosis not present

## 2017-06-24 DIAGNOSIS — E034 Atrophy of thyroid (acquired): Secondary | ICD-10-CM

## 2017-06-24 DIAGNOSIS — M797 Fibromyalgia: Secondary | ICD-10-CM | POA: Diagnosis not present

## 2017-06-24 MED ORDER — LEVOTHYROXINE SODIUM 50 MCG PO TABS: ORAL_TABLET | ORAL | 3 refills | Status: DC

## 2017-06-24 MED ORDER — DULOXETINE HCL 30 MG PO CPEP: 30.0000 mg | ORAL_CAPSULE | Freq: Every day | ORAL | 1 refills | Status: DC

## 2017-06-24 MED ORDER — VALACYCLOVIR HCL 1 G PO TABS: ORAL_TABLET | ORAL | 5 refills | Status: DC

## 2017-06-24 NOTE — Progress Notes (Signed)
BP 106/75   Pulse 91   Temp 98 F (36.7 C) (Oral)   Ht  (1.651 m)   Wt 118 lb 6 oz (53.7 kg)   BMI 19.70 kg/m    Subjective:    Patient ID: Shari Knox, female    DOB: 1971-02-03, 46 y.o.   MRN: [id number]  HPI: Shari Knox is a 46 y.o. female presenting on 06/24/2017 for Medication follow up/refills   HPI Hypothyroidism recheck Patient is coming in for thyroid recheck today as well. They deny any issues with hair changes or heat or cold problems or diarrhea or constipation. They deny any chest pain or palpitations. They are currently on levothyroxine   Fibromyalgia and major depression Patient is coming in for recheck and refill of medications for fibromyalgia and depression. She is currently on Cymbalta and feels like she has been doing very well on the current dose Cymbalta and denies any major side effects from it. She denies any suicidal ideations or thoughts of hurting herself. Depression screen [facility] 2/9 06/24/2017 01/09/2017 12/11/2016 10/30/2016 01/29/2016  Decreased Interest 0 0 Down, Depressed, Hopeless 0 0 PHQ - 2 Score 0 0 Altered sleeping - - Tired, decreased energy - - Change in appetite - - 0 0 1  Feeling bad or failure about yourself  - - 0 0 1  Trouble concentrating - - 1 0 0  Moving slowly or fidgety/restless - - 0 1 0  Suicidal thoughts - - 0 0 0  PHQ-9 Score - - Difficult doing work/chores - - Extremely dIfficult - -    Cold sore medication refill Patient needs a medication refill for cold sores. She does not currently have a cold sore. She takes Valtrex when she gets a flareup.  Relevant past medical, surgical, family and social history reviewed and updated as indicated. Interim medical history since our last visit reviewed. Allergies and medications reviewed and updated.  Review of Systems  Constitutional: Negative for chills and fever.  Eyes: Negative for visual disturbance.  Respiratory: Negative  for chest tightness and shortness of breath.   Cardiovascular: Negative for chest pain and leg swelling.  Endocrine: Negative for cold intolerance and heat intolerance.  Musculoskeletal: Negative for back pain and gait problem.  Skin: Negative for rash.  Neurological: Negative for dizziness, light-headedness and headaches.  Psychiatric/Behavioral: Negative for agitation, behavioral problems, decreased concentration, dysphoric mood, self-injury, sleep disturbance and suicidal ideas. The patient is not nervous/anxious.   All other systems reviewed and are negative.   Per HPI unless specifically indicated above     Objective:    BP 106/75   Pulse 91   Temp 98 F (36.7 C) (Oral)   Ht  (1.651 m)   Wt 118 lb 6 oz (53.7 kg)   BMI 19.70 kg/m   Wt Readings from Last 3 Encounters:  06/24/17 118 lb 6 oz (53.7 kg)  01/09/17 121 lb (54.9 kg)  12/11/16 120 lb 6 oz (54.6 kg)    Physical Exam  Constitutional: She is oriented to person, place, and time. She appears well-developed and well-nourished. No distress.  Eyes: Conjunctivae are normal.  Neck: Neck supple. No thyromegaly present.  Cardiovascular: Normal rate, regular rhythm, normal heart sounds and intact distal pulses.   No murmur heard. Pulmonary/Chest: Effort normal and breath sounds normal. No respiratory distress. She has no  wheezes. She has no rales.  Musculoskeletal: Normal range of motion. She exhibits no edema.  Lymphadenopathy:    She has no cervical adenopathy.  Neurological: She is alert and oriented to person, place, and time. Coordination normal.  Skin: Skin is warm and dry. No rash noted. She is not diaphoretic.  Psychiatric: She has a normal mood and affect. Her behavior is normal. Judgment normal. Her mood appears not anxious. She does not exhibit a depressed mood. She expresses no suicidal ideation. She expresses no suicidal plans.  Nursing note and vitals reviewed.      Assessment & Plan:   Problem List  Items Addressed This Visit      Endocrine   Hypothyroidism - Primary   Relevant Medications   levothyroxine (SYNTHROID, LEVOTHROID) 50 MCG tablet   Other Relevant Orders   TSH (Completed)     Other   Depression   Relevant Medications   DULoxetine (CYMBALTA) 30 MG capsule   HSV-1 (herpes simplex virus 1) infection   Relevant Medications   valACYclovir (VALTREX) 1000 MG tablet    Other Visit Diagnoses    Fibromyalgia       Relevant Medications   DULoxetine (CYMBALTA) 30 MG capsule       Follow up plan: Return in about 6 months (around 12/22/2017), or if symptoms worsen or fail to improve, for Recheck depression and fibromyalgia.  Counseling provided for all of the vaccine components Orders Placed This Encounter  Procedures  . TSH    Arville Care, MD [facility] 06/24/2017, 4:59 PM

## 2017-06-25 LAB — TSH: TSH: 3.05 u[IU]/mL (ref 0.450–4.500)

## 2017-10-14 ENCOUNTER — Encounter: Payer: Self-pay | Admitting: Physician Assistant

## 2017-10-14 ENCOUNTER — Ambulatory Visit: Payer: Managed Care, Other (non HMO) | Admitting: Physician Assistant

## 2017-10-14 VITALS — BP 108/75 | HR 96 | Temp 97.8°F | Resp 18 | Ht 65.0 in | Wt 119.6 lb

## 2017-10-14 DIAGNOSIS — J40 Bronchitis, not specified as acute or chronic: Secondary | ICD-10-CM

## 2017-10-14 MED ORDER — HYDROCODONE-HOMATROPINE 5-1.5 MG/5ML PO SYRP: 5.0000 mL | ORAL_SOLUTION | Freq: Four times a day (QID) | ORAL | 0 refills | Status: DC | PRN

## 2017-10-14 MED ORDER — AZITHROMYCIN 250 MG PO TABS: ORAL_TABLET | ORAL | 0 refills | Status: DC

## 2017-10-14 MED ORDER — PREDNISONE 10 MG (21) PO TBPK: ORAL_TABLET | ORAL | 0 refills | Status: DC

## 2017-10-14 NOTE — Progress Notes (Signed)
BP 108/75   Pulse 96   Temp 97.8 F (36.6 C) (Oral)   Resp 18   Ht 5\' 5"  (1.651 m)   Wt 119 lb 9.6 oz (54.3 kg)   SpO2 97%   BMI 19.90 kg/m    Subjective:    Patient ID: Shari Knox, female    DOB: [birth year], 47 y.o.   MRN: [id number]  HPI: Shari Knox is a 47 y.o. female presenting on 10/14/2017 for Sore Throat; Cough; and Nasal Congestion Patient with several days of progressing upper respiratory and bronchial symptoms. Initially there was more upper respiratory congestion. This progressed to having significant cough that is productive throughout the day and severe at night. There is occasional wheezing after coughing. Sometimes there is slight dyspnea on exertion. It is productive mucus that is yellow in color. Denies any blood.  Relevant past medical, surgical, family and social history reviewed and updated as indicated. Allergies and medications reviewed and updated.  Past Medical History:  Diagnosis Date  . Anxiety   . Depression   . Herpes labialis   . Thyroid disease     Past Surgical History:  Procedure Laterality Date  . NOVASURE ABLATION  08/2016    Review of Systems  Constitutional: Positive for chills and fatigue. Negative for activity change, appetite change and fever.  HENT: Positive for congestion, postnasal drip and sore throat.   Eyes: Negative.   Respiratory: Positive for cough and wheezing. Negative for shortness of breath.   Cardiovascular: Negative.  Negative for chest pain, palpitations and leg swelling.  Gastrointestinal: Negative.   Genitourinary: Negative.   Musculoskeletal: Negative.   Skin: Negative.   Neurological: Positive for headaches.    Allergies as of 10/14/2017   No Known Allergies     Medication List        Accurate as of 10/14/17  6:31 PM. Always use your most recent med list.          acetaminophen 325 MG tablet Commonly known as:  TYLENOL Take 650 mg by mouth every 6 (six) hours as needed.   azithromycin 250 MG  tablet Commonly known as:  ZITHROMAX Z-PAK Take as directed   DULoxetine 30 MG capsule Commonly known as:  CYMBALTA Take 1 capsule (30 mg total) by mouth daily.   HYDROcodone-homatropine 5-1.5 MG/5ML syrup Commonly known as:  HYCODAN Take 5-10 mLs by mouth every 6 (six) hours as needed.   ibuprofen 100 MG tablet Commonly known as:  ADVIL,MOTRIN Take 600 mg by mouth every 6 (six) hours as needed for fever.   levothyroxine 50 MCG tablet Commonly known as:  SYNTHROID, LEVOTHROID TAKE (1) TABLET DAILY BE- FORE BREAKFAST.   predniSONE 10 MG (21) Tbpk tablet Commonly known as:  STERAPRED UNI-PAK 21 TAB As directed x 6 days   valACYclovir 1000 MG tablet Commonly known as:  VALTREX TAKE 2 TABLETS TWICE DAILY AT ONSET OF FEVER BLISTER          Objective:    BP 108/75   Pulse 96   Temp 97.8 F (36.6 C) (Oral)   Resp 18   Ht 5\' 5"  (1.651 m)   Wt 119 lb 9.6 oz (54.3 kg)   SpO2 97%   BMI 19.90 kg/m   No Known Allergies  Physical Exam  Constitutional: She is oriented to person, place, and time. She appears well-developed and well-nourished.  HENT:  Head: Normocephalic and atraumatic.  Right Ear: There is drainage and tenderness.  Left Ear: There is drainage and  tenderness.  Nose: Mucosal edema and rhinorrhea present. Right sinus exhibits no maxillary sinus tenderness and no frontal sinus tenderness. Left sinus exhibits no maxillary sinus tenderness and no frontal sinus tenderness.  Mouth/Throat: Oropharyngeal exudate and posterior oropharyngeal erythema present.  Eyes: Conjunctivae and EOM are normal. Pupils are equal, round, and reactive to light.  Neck: Normal range of motion. Neck supple.  Cardiovascular: Normal rate, regular rhythm, normal heart sounds and intact distal pulses.  Pulmonary/Chest: Effort normal. She has wheezes in the right upper field and the left upper field.  Abdominal: Soft. Bowel sounds are normal.  Neurological: She is alert and oriented to  person, place, and time. She has normal reflexes.  Skin: Skin is warm and dry. No rash noted.  Psychiatric: She has a normal mood and affect. Her behavior is normal. Judgment and thought content normal.    Results for orders placed or performed in visit on 06/24/17  TSH  Result Value Ref Range   TSH 3.050 0.450 - 4.500 uIU/mL      Assessment & Plan:   Bronchitis - Plan: HYDROcodone-homatropine (HYCODAN) 5-1.5 MG/5ML syrup, azithromycin (ZITHROMAX Z-PAK) 250 MG tablet, predniSONE (STERAPRED UNI-PAK 21 TAB) 10 MG (21) TBPK tablet     Current Outpatient Medications:  .  acetaminophen (TYLENOL) 325 MG tablet, Take 650 mg by mouth every 6 (six) hours as needed., Disp: , Rfl:  .  DULoxetine (CYMBALTA) 30 MG capsule, Take 1 capsule (30 mg total) by mouth daily., Disp: 90 capsule, Rfl: 1 .  ibuprofen (ADVIL,MOTRIN) 100 MG tablet, Take 600 mg by mouth every 6 (six) hours as needed for fever., Disp: , Rfl:  .  levothyroxine (SYNTHROID, LEVOTHROID) 50 MCG tablet, TAKE (1) TABLET DAILY BE- FORE BREAKFAST., Disp: 90 tablet, Rfl: 3 .  valACYclovir (VALTREX) 1000 MG tablet, TAKE 2 TABLETS TWICE DAILY AT ONSET OF FEVER BLISTER, Disp: 12 tablet, Rfl: 5 .  azithromycin (ZITHROMAX Z-PAK) 250 MG tablet, Take as directed, Disp: 6 each, Rfl: 0 .  HYDROcodone-homatropine (HYCODAN) 5-1.5 MG/5ML syrup, Take 5-10 mLs by mouth every 6 (six) hours as needed., Disp: 240 mL, Rfl: 0 .  predniSONE (STERAPRED UNI-PAK 21 TAB) 10 MG (21) TBPK tablet, As directed x 6 days, Disp: 21 tablet, Rfl: 0 Continue all other maintenance medications as listed above.  Follow up plan: No Follow-up on file.  Educational handout given for survey   Remus LofflerAngel S. Demarques Pilz PA-C [facility] [address]  [place], [place] [id number] [phone]   10/14/2017, 6:32 PM

## 2017-10-14 NOTE — Patient Instructions (Signed)
In a few days you may receive a survey in the mail or online from [employer] regarding your visit with us today. Please take a moment to fill this out. Your feedback is very important to our whole office. It can help us better understand your needs as well as improve your experience and satisfaction. Thank you for taking your time to complete it. We care about you.  Jahzion Brogden, PA-C  

## 2018-01-25 ENCOUNTER — Other Ambulatory Visit: Payer: Self-pay | Admitting: Family Medicine

## 2018-01-25 DIAGNOSIS — M797 Fibromyalgia: Secondary | ICD-10-CM

## 2018-01-25 NOTE — Telephone Encounter (Signed)
Patient aware and appointment given for 05/10

## 2018-01-25 NOTE — Telephone Encounter (Signed)
Patient needs appointment ASAP, can give her enough medication through that appointment

## 2018-02-12 ENCOUNTER — Encounter: Payer: Self-pay | Admitting: Family Medicine

## 2018-02-12 ENCOUNTER — Ambulatory Visit: Payer: Managed Care, Other (non HMO) | Admitting: Family Medicine

## 2018-02-12 VITALS — BP 112/79 | HR 77 | Temp 97.6°F | Ht 65.0 in | Wt 125.0 lb

## 2018-02-12 DIAGNOSIS — F411 Generalized anxiety disorder: Secondary | ICD-10-CM

## 2018-02-12 DIAGNOSIS — W57XXXA Bitten or stung by nonvenomous insect and other nonvenomous arthropods, initial encounter: Secondary | ICD-10-CM | POA: Diagnosis not present

## 2018-02-12 DIAGNOSIS — F3341 Major depressive disorder, recurrent, in partial remission: Secondary | ICD-10-CM

## 2018-02-12 DIAGNOSIS — Z131 Encounter for screening for diabetes mellitus: Secondary | ICD-10-CM

## 2018-02-12 DIAGNOSIS — E034 Atrophy of thyroid (acquired): Secondary | ICD-10-CM

## 2018-02-12 DIAGNOSIS — R000 Tachycardia, unspecified: Secondary | ICD-10-CM

## 2018-02-12 DIAGNOSIS — M797 Fibromyalgia: Secondary | ICD-10-CM

## 2018-02-12 DIAGNOSIS — Z13220 Encounter for screening for lipoid disorders: Secondary | ICD-10-CM

## 2018-02-12 MED ORDER — DULOXETINE HCL 30 MG PO CPEP: 30.0000 mg | ORAL_CAPSULE | Freq: Every day | ORAL | 1 refills | Status: DC

## 2018-02-12 NOTE — Progress Notes (Signed)
BP 112/79   Pulse 77   Temp 97.6 F (36.4 C) (Oral)   Ht 5' 5"  (1.651 m)   Wt 125 lb (56.7 kg)   BMI 20.80 kg/m    Subjective:    Patient ID: Shari Knox, female    DOB: September 08, 1971, 47 y.o.   MRN: [id number]  HPI: Shari Knox is a 47 y.o. female presenting on 02/12/2018 for Hypothyroidism; Medication Refill (Cymbalta, had about a 7-10 days where she felt it was not helping, but it seems to have gotten better); and Episodes of pounding heartbeat, flutter   HPI Hypothyroidism and fatigue and fibromyalgia and palpitations recheck Patient is coming in for thyroid recheck today as well. They deny any issues with hair changes or heat or cold problems or diarrhea or constipation.  Patient does complain of palpitations and flutters and does not know if this could be due to anxiety or and is coming to discuss this today.  She says she is also had a tick bite in March almost 2 months ago and wanted to have testing for Lyme disease.  She just feels anxious and palpitations and fatigued and a lot has been going on.. They are currently on levothyroxine 72mcrograms.  She says she has been feeling like this over the past month.  She also says she is been feeling somewhat down and depressed and anxious along with it but denies any suicidal ideations or thoughts of hurting herself.  She has been taking Cymbalta 30 and felt like this 60 that she had tried was too much so she came back to 30 and feels like it does good for her on the moods any motion fibromyalgia for the most part but she is just been having more fatigue and the palpitations and that is what she was concerned about this time. [facility] 02/12/2018 10/14/2017 06/24/2017 01/09/2017 12/11/2016  Decreased Interest 0 0 0 0 2  Down, Depressed, Hopeless 0 0 0 0 1  PHQ - 2 Score 0 0 0 0 3  Altered sleeping - - - - 2  Tired, decreased energy - - - - 3  Change in appetite - - - - 0  Feeling bad or failure about yourself  - - - - 0  Trouble  concentrating - - - - 1  Moving slowly or fidgety/restless - - - - 0  Suicidal thoughts - - - - 0  PHQ-9 Score - - - - 9  Difficult doing work/chores - - - - Extremely dIfficult     Relevant past medical, surgical, family and social history reviewed and updated as indicated. Interim medical history since our last visit reviewed. Allergies and medications reviewed and updated.  Review of Systems  Constitutional: Positive for fatigue. Negative for chills and fever.  HENT: Negative for congestion, ear discharge and ear pain.   Eyes: Negative for redness and visual disturbance.  Respiratory: Negative for chest tightness and shortness of breath.   Cardiovascular: Positive for palpitations. Negative for chest pain and leg swelling.  Genitourinary: Negative for difficulty urinating and dysuria.  Musculoskeletal: Negative for back pain and gait problem.  Skin: Negative for rash.  Neurological: Negative for light-headedness and headaches.  Psychiatric/Behavioral: Negative for agitation, behavioral problems, dysphoric mood, self-injury, sleep disturbance and suicidal ideas. The patient is nervous/anxious.   All other systems reviewed and are negative.   Per HPI unless specifically indicated above   Allergies as of 02/12/2018   No Known Allergies  Medication List        Accurate as of 02/12/18  4:24 PM. Always use your most recent med list.          acetaminophen 325 MG tablet Commonly known as:  TYLENOL Take 650 mg by mouth every 6 (six) hours as needed.   azithromycin 250 MG tablet Commonly known as:  ZITHROMAX Z-PAK Take as directed   DULoxetine 30 MG capsule Commonly known as:  CYMBALTA Take 1 capsule (30 mg total) by mouth daily.   ibuprofen 100 MG tablet Commonly known as:  ADVIL,MOTRIN Take 600 mg by mouth every 6 (six) hours as needed for fever.   levothyroxine 50 MCG tablet Commonly known as:  SYNTHROID, LEVOTHROID TAKE (1) TABLET DAILY BE- FORE BREAKFAST.     valACYclovir 1000 MG tablet Commonly known as:  VALTREX TAKE 2 TABLETS TWICE DAILY AT ONSET OF FEVER BLISTER          Objective:    BP 112/79   Pulse 77   Temp 97.6 F (36.4 C) (Oral)   Ht 5' 5"  (1.651 m)   Wt 125 lb (56.7 kg)   BMI 20.80 kg/m   Wt Readings from Last 3 Encounters:  02/12/18 125 lb (56.7 kg)  10/14/17 119 lb 9.6 oz (54.3 kg)  06/24/17 118 lb 6 oz (53.7 kg)    Physical Exam  Constitutional: She is oriented to person, place, and time. She appears well-developed and well-nourished. No distress.  Eyes: Conjunctivae are normal.  Cardiovascular: Normal rate, regular rhythm, normal heart sounds and intact distal pulses. Exam reveals no friction rub.  No murmur heard. Pulmonary/Chest: Effort normal and breath sounds normal. No respiratory distress. She has no wheezes.  Musculoskeletal: Normal range of motion. She exhibits no edema or tenderness.  Neurological: She is alert and oriented to person, place, and time. Coordination normal.  Skin: Skin is warm and dry. No rash noted. She is not diaphoretic.  Psychiatric: She has a normal mood and affect. Her behavior is normal.  Nursing note and vitals reviewed.    EKG: Normal sinus rhythm, nothing to compare to from previous.    Assessment & Plan:   Problem List Items Addressed This Visit      Endocrine   Hypothyroidism   Relevant Orders   Thyroid Panel With TSH (Completed)   CBC with Differential/Platelet (Completed)     Other   Depression   Relevant Medications   DULoxetine (CYMBALTA) 30 MG capsule   GAD (generalized anxiety disorder)   Relevant Medications   DULoxetine (CYMBALTA) 30 MG capsule   Other Relevant Orders   CBC with Differential/Platelet (Completed)    Other Visit Diagnoses    Racing heart beat    -  Primary   Relevant Orders   EKG 12-Lead (Completed)   Lipid screening       Relevant Orders   Lipid panel (Completed)   Diabetes mellitus screening       Relevant Orders   CMP14+EGFR  (Completed)   Tick bite, initial encounter       Tick bite left hip, March, almost 2 months ago, will test for recommended and Lyme   Relevant Orders   Rocky mtn spotted fvr abs pnl(IgG+IgM) (Completed)   Lyme Ab/Western Blot Reflex (Completed)   Fibromyalgia       Relevant Medications   DULoxetine (CYMBALTA) 30 MG capsule     Continue her Cymbalta at the current dose, will test for recommend spotted fever and other lab values.  We will also check her thyroid to see where it is at currently.  Follow up plan: Return in about 6 months (around 08/15/2018), or if symptoms worsen or fail to improve, for Recheck thyroid and depression and anxiety and myalgias.  Counseling provided for all of the vaccine components Orders Placed This Encounter  Procedures  . Thyroid Panel With TSH  . CBC with Differential/Platelet  . CMP14+EGFR  . Lipid panel  . Rocky mtn spotted fvr abs pnl(IgG+IgM)  . Lyme Ab/Western Blot Reflex  . EKG 12-Lead    Caryl Pina, MD [facility] 02/12/2018, 4:24 PM

## 2018-02-17 LAB — LYME, WESTERN BLOT, SERUM (REFLEXED)
IGG P18 AB.: ABSENT
IGG P28 AB.: ABSENT
IGG P30 AB.: ABSENT
IGG P39 AB.: ABSENT
IGG P45 AB.: ABSENT
IGG P58 AB.: ABSENT
IGG P93 AB.: ABSENT
IGM P23 AB.: ABSENT
IGM P39 AB.: ABSENT
IgG P23 Ab.: ABSENT
IgG P66 Ab.: ABSENT
IgM P41 Ab.: ABSENT
LYME IGG WB: NEGATIVE
Lyme IgM Wb: NEGATIVE

## 2018-02-17 LAB — CMP14+EGFR
A/G RATIO: 1.8 (ref 1.2–2.2)
ALT: 16 IU/L (ref 0–32)
AST: 22 IU/L (ref 0–40)
Albumin: 4.5 g/dL (ref 3.5–5.5)
Alkaline Phosphatase: 55 IU/L (ref 39–117)
BUN / CREAT RATIO: 11 (ref 9–23)
BUN: 6 mg/dL (ref 6–24)
Bilirubin Total: 0.4 mg/dL (ref 0.0–1.2)
CALCIUM: 9.4 mg/dL (ref 8.7–10.2)
CO2: 29 mmol/L (ref 20–29)
CREATININE: 0.57 mg/dL (ref 0.57–1.00)
Chloride: 98 mmol/L (ref 96–106)
GFR calc Af Amer: 129 mL/min/{1.73_m2} (ref >59)
GFR, EST NON AFRICAN AMERICAN: 112 mL/min/{1.73_m2} (ref >59)
GLUCOSE: 88 mg/dL (ref 65–99)
Globulin, Total: 2.5 g/dL (ref 1.5–4.5)
Potassium: 3.6 mmol/L (ref 3.5–5.2)
Sodium: 139 mmol/L (ref 134–144)
Total Protein: 7 g/dL (ref 6.0–8.5)

## 2018-02-17 LAB — CBC WITH DIFFERENTIAL/PLATELET
BASOS ABS: 0.1 10*3/uL (ref 0.0–0.2)
Basos: 1 %
EOS (ABSOLUTE): 0.6 10*3/uL — AB (ref 0.0–0.4)
Eos: 9 %
Hematocrit: 37 % (ref 34.0–46.6)
Hemoglobin: 12.3 g/dL (ref 11.1–15.9)
IMMATURE GRANS (ABS): 0 10*3/uL (ref 0.0–0.1)
IMMATURE GRANULOCYTES: 0 %
LYMPHS: 26 %
Lymphocytes Absolute: 1.7 10*3/uL (ref 0.7–3.1)
MCH: 29.7 pg (ref 26.6–33.0)
MCHC: 33.2 g/dL (ref 31.5–35.7)
MCV: 89 fL (ref 79–97)
MONOS ABS: 0.5 10*3/uL (ref 0.1–0.9)
Monocytes: 8 %
NEUTROS PCT: 56 %
Neutrophils Absolute: 3.9 10*3/uL (ref 1.4–7.0)
PLATELETS: 368 10*3/uL (ref 150–379)
RBC: 4.14 x10E6/uL (ref 3.77–5.28)
RDW: 13.6 % (ref 12.3–15.4)
WBC: 6.8 10*3/uL (ref 3.4–10.8)

## 2018-02-17 LAB — THYROID PANEL WITH TSH
Free Thyroxine Index: 1.5 (ref 1.2–4.9)
T3 UPTAKE RATIO: 23 % — AB (ref 24–39)
T4 TOTAL: 6.4 ug/dL (ref 4.5–12.0)
TSH: 6.58 u[IU]/mL — ABNORMAL HIGH (ref 0.450–4.500)

## 2018-02-17 LAB — ROCKY MTN SPOTTED FVR ABS PNL(IGG+IGM)
RMSF IGM: 0.7 {index} (ref 0.00–0.89)
RMSF IgG: NEGATIVE

## 2018-02-17 LAB — LIPID PANEL
Chol/HDL Ratio: 2.5 ratio (ref 0.0–4.4)
Cholesterol, Total: 141 mg/dL (ref 100–199)
HDL: 57 mg/dL (ref >39)
LDL CALC: 71 mg/dL (ref 0–99)
Triglycerides: 63 mg/dL (ref 0–149)
VLDL CHOLESTEROL CAL: 13 mg/dL (ref 5–40)

## 2018-02-17 LAB — LYME AB/WESTERN BLOT REFLEX: Lyme IgG/IgM Ab: 1.13 {ISR} — ABNORMAL HIGH (ref 0.00–0.90)

## 2018-02-18 ENCOUNTER — Telehealth: Payer: Self-pay | Admitting: Family Medicine

## 2018-02-18 MED ORDER — LEVOTHYROXINE SODIUM 75 MCG PO TABS: 75.0000 ug | ORAL_TABLET | Freq: Every day | ORAL | 1 refills | Status: DC

## 2018-02-18 MED ORDER — DOXYCYCLINE HYCLATE 100 MG PO TABS: 100.0000 mg | ORAL_TABLET | Freq: Two times a day (BID) | ORAL | 0 refills | Status: DC

## 2018-02-18 NOTE — Telephone Encounter (Signed)
Pt aware of results and rx sent to pharmacy per Dr Dettinger result notes.

## 2018-03-04 ENCOUNTER — Ambulatory Visit: Payer: Managed Care, Other (non HMO) | Admitting: Family Medicine

## 2018-03-04 VITALS — BP 125/84 | HR 115 | Temp 100.7°F | Ht 65.0 in | Wt 121.0 lb

## 2018-03-04 DIAGNOSIS — A084 Viral intestinal infection, unspecified: Secondary | ICD-10-CM

## 2018-03-04 DIAGNOSIS — R509 Fever, unspecified: Secondary | ICD-10-CM

## 2018-03-04 DIAGNOSIS — E860 Dehydration: Secondary | ICD-10-CM | POA: Diagnosis not present

## 2018-03-04 LAB — VERITOR FLU A/B WAIVED
INFLUENZA A: NEGATIVE
Influenza B: NEGATIVE

## 2018-03-04 MED ORDER — PROMETHAZINE HCL 25 MG/ML IJ SOLN
12.5000 mg | Freq: Once | INTRAMUSCULAR | Status: AC
  Administered 2018-03-04: 12.5 mg via INTRAMUSCULAR

## 2018-03-04 MED ORDER — ONDANSETRON 4 MG PO TBDP: 4.0000 mg | ORAL_TABLET | Freq: Three times a day (TID) | ORAL | 0 refills | Status: DC | PRN

## 2018-03-04 MED ORDER — SODIUM CHLORIDE 0.9 % IV BOLUS
500.0000 mL | Freq: Once | INTRAVENOUS | Status: AC
  Administered 2018-03-04: 500 mL via INTRAVENOUS

## 2018-03-04 MED ORDER — ONDANSETRON 4 MG PO TBDP
4.0000 mg | ORAL_TABLET | Freq: Once | ORAL | Status: AC
  Administered 2018-03-04: 4 mg via ORAL

## 2018-03-04 NOTE — Patient Instructions (Addendum)
You are given half of a liter of IV fluids here in office.  You are also given a dose of Zofran orally disintegrating tablet.  You may repeat your Zofran dose around 10:00 tonight if needed.  This prescription has been sent to your pharmacy.  If you develop any other worrisome symptoms or signs that we discussed, when she seek immediate medical attention.  Viral Gastroenteritis, Adult Viral gastroenteritis is also known as the stomach flu. This condition is caused by certain germs (viruses). These germs can be passed from person to person very easily (are very contagious). This condition can cause sudden watery poop (diarrhea), fever, and throwing up (vomiting). Having watery poop and throwing up can make you feel weak and cause you to get dehydrated. Dehydration can make you tired and thirsty, make you have a dry mouth, and make it so you pee (urinate) less often. Older adults and people with other diseases or a weak defense system (immune system) are at higher risk for dehydration. It is important to replace the fluids that you lose from having watery poop and throwing up. Follow these instructions at home: Follow instructions from your doctor about how to care for yourself at home. Eating and drinking  Follow these instructions as told by your doctor:  Take an oral rehydration solution (ORS). This is a drink that is sold at pharmacies and stores.  Drink clear fluids in small amounts as you are able, such as: ? Water. ? Ice chips. ? Diluted fruit juice. ? Low-calorie sports drinks.  Eat bland, easy-to-digest foods in small amounts as you are able, such as: ? Bananas. ? Applesauce. ? Rice. ? Low-fat (lean) meats. ? Toast. ? Crackers.  Avoid fluids that have a lot of sugar or caffeine in them.  Avoid alcohol.  Avoid spicy or fatty foods.  General instructions  Drink enough fluid to keep your pee (urine) clear or pale yellow.  Wash your hands often. If you cannot use soap and  water, use hand sanitizer.  Make sure that all people in your home wash their hands well and often.  Rest at home while you get better.  Take over-the-counter and prescription medicines only as told by your doctor.  Watch your condition for any changes.  Take a warm bath to help with any burning or pain from having watery poop.  Keep all follow-up visits as told by your doctor. This is important. Contact a doctor if:  You cannot keep fluids down.  Your symptoms get worse.  You have new symptoms.  You feel light-headed or dizzy.  You have muscle cramps. Get help right away if:  You have chest pain.  You feel very weak or you pass out (faint).  You see blood in your throw-up.  Your throw-up looks like coffee grounds.  You have bloody or black poop (stools) or poop that look like tar.  You have a very bad headache, a stiff neck, or both.  You have a rash.  You have very bad pain, cramping, or bloating in your belly (abdomen).  You have trouble breathing.  You are breathing very quickly.  Your heart is beating very quickly.  Your skin feels cold and clammy.  You feel confused.  You have pain when you pee.  You have signs of dehydration, such as: ? Dark pee, hardly any pee, or no pee. ? Cracked lips. ? Dry mouth. ? Sunken eyes. ? Sleepiness. ? Weakness. This information is not intended to replace advice given  to you by your health care provider. Make sure you discuss any questions you have with your health care provider. Document Released: 03/10/2008 Document Revised: 04/11/2016 Document Reviewed: 05/29/2015 Elsevier Interactive Patient Education  2017 [employer].

## 2018-03-04 NOTE — Addendum Note (Signed)
Addended byDory Peru on: 03/04/2018 04:34 PM   Modules accepted: Orders

## 2018-03-04 NOTE — Progress Notes (Signed)
Subjective: CC: ?Stomach bug PCP: Dettinger, Shari Radon, MD AVW:UJWJX Knox is a 47 y.o. female presenting to clinic today for:  1. GI illness Patient reports onset of nausea and vomiting last evening around 930.  She notes that she actually had some symptoms with diarrhea after coming back home from dinner on Sunday but that this had resolved.  Her husband is sick with similar.  She notes fevers at home.  She attempted to drink fluids and take Tylenol this morning but was unable to keep anything down.  Last episode of vomitus was around 12:10 PM today.  Vomit and diarrhea both nonbloody.  She attempted to take meclizine to help with nausea but has been unable to take any medications because she is unable to keep anything down.  No known consumption of undercooked foods, foods left out too long, contact with farm animals or zoo animals.  No recent travel.  She did sustain a tick bite about 2-1/2 weeks ago and was put on doxycycline.  She was on the doxycycline for about a week and then discontinued because of GI side effects.   ROS: Per HPI  No Known Allergies Past Medical History:  Diagnosis Date  . Anxiety   . Depression   . Herpes labialis   . Thyroid disease     Current Outpatient Medications:  .  acetaminophen (TYLENOL) 325 MG tablet, Take 650 mg by mouth every 6 (six) hours as needed., Disp: , Rfl:  .  DULoxetine (CYMBALTA) 30 MG capsule, Take 1 capsule (30 mg total) by mouth daily., Disp: 90 capsule, Rfl: 1 .  ibuprofen (ADVIL,MOTRIN) 100 MG tablet, Take 600 mg by mouth every 6 (six) hours as needed for fever., Disp: , Rfl:  .  levothyroxine (SYNTHROID, LEVOTHROID) 75 MCG tablet, Take 1 tablet (75 mcg total) by mouth daily., Disp: 90 tablet, Rfl: 1 .  valACYclovir (VALTREX) 1000 MG tablet, TAKE 2 TABLETS TWICE DAILY AT ONSET OF FEVER BLISTER, Disp: 12 tablet, Rfl: 5 .  ondansetron (ZOFRAN ODT) 4 MG disintegrating tablet, Take 1 tablet (4 mg total) by mouth every 8 (eight) hours as  needed for nausea or vomiting., Disp: 10 tablet, Rfl: 0  Current Facility-Administered Medications:  .  ondansetron (ZOFRAN-ODT) disintegrating tablet 4 mg, 4 mg, Oral, Once, Shari Fehr M, DO Social History   Socioeconomic History  . Marital status: Married    Spouse name: Not on file  . Number of children: Not on file  . Years of education: Not on file  . Highest education level: Not on file  Occupational History  . Not on file  Social Needs  . Financial resource strain: Not on file  . Food insecurity:    Worry: Not on file    Inability: Not on file  . Transportation needs:    Medical: Not on file    Non-medical: Not on file  Tobacco Use  . Smoking status: Former Smoker    Last attempt to quit: 10/06/1996    Years since quitting: 21.4  . Smokeless tobacco: Never Used  Substance and Sexual Activity  . Alcohol use: No  . Drug use: No  . Sexual activity: Not on file  Lifestyle  . Physical activity:    Days per week: Not on file    Minutes per session: Not on file  . Stress: Not on file  Relationships  . Social connections:    Talks on phone: Not on file    Gets together: Not on file  Attends religious service: Not on file    Active member of club or organization: Not on file    Attends meetings of clubs or organizations: Not on file    Relationship status: Not on file  . Intimate partner violence:    Fear of current or ex partner: Not on file    Emotionally abused: Not on file    Physically abused: Not on file    Forced sexual activity: Not on file  Other Topics Concern  . Not on file  Social History Narrative  . Not on file   Family History  Problem Relation Age of Onset  . Hypothyroidism Mother   . Cancer Mother        Breast  . Heart disease Maternal Grandmother        Heart Bypass surgery  . Cancer Maternal Grandfather        Lung    Objective: Office vital signs reviewed. BP 125/84   Pulse (!) 115   Temp (!) 100.7 F (38.2 C) (Oral)    Ht  (1.651 Knox)   Wt 121 lb (54.9 kg)   BMI 20.14 kg/Knox   Physical Examination:  General: Awake, alert, tired appearing, No acute distress HEENT: Normal    Neck: No masses palpated. No lymphadenopathy    Eyes: PERRLA, extraocular membranes intact, sclera white; large dark circles present under eyes    Throat: mucus membranes Tacky Cardio: tachycardic w/ regular rhythm, S1S2 heard, no murmurs appreciated Pulm: clear to auscultation bilaterally, no wheezes, rhonchi or rales; normal work of breathing on room air GI: soft, generalized TTP, no peritoneal signs, non-distended, bowel sounds present x4, no hepatomegaly, no splenomegaly, no masses Extremities: warm, No edema, cyanosis or clubbing; +2 pulses bilaterally  2:20pm: Checked in on patient.  Continues to have some nausea despite Zofran.  IV fluid bolus pending. 2.45pm: Patient's nausea improving but still somewhat present.  IV fluids running.  They are about 30% complete. 3:03pm: Patient attempted to drink water and immediately vomited it back up.  We will proceed with promethazine IM 12.5 mg. 3:21pm: IVF completed.   Assessment/ Plan: 47 y.o. female   1. Viral gastroenteritis Rapid flu negative.  No evidence of acute abdomen on today's exam.  I suspect this is a viral process.  She is dehydrated on exam.  She is febrile and tachycardic.  For this reason, proceeded with 500 cc IV fluid bolus.  She was given a dose of Zofran ODT 4 mg here in office.  Therapies did improve symptoms.  Then she was fluid challenged with water and vomited.  She was given a dose of promethazine 12.5 mg IM.  Nausea did improve from a 10/10 to a 6/10 after administration.  She was discharged in stable condition home with strict instructions.  Zofran prescribed to use as needed.  Excuse from work through Monday.  Return precautions and reasons for emergent evaluation the emergency department discussed.  Follow-up as needed. - ondansetron (ZOFRAN-ODT)  disintegrating tablet 4 mg  2. Dehydration As above.   Meds ordered this encounter  Medications  . ondansetron (ZOFRAN-ODT) disintegrating tablet 4 mg  . ondansetron (ZOFRAN ODT) 4 MG disintegrating tablet    Sig: Take 1 tablet (4 mg total) by mouth every 8 (eight) hours as needed for nausea or vomiting.    Dispense:  10 tablet    Refill:  0    Shari Laborde Hulen Skains, DO [facility] [phone]

## 2018-03-10 ENCOUNTER — Other Ambulatory Visit: Payer: Self-pay | Admitting: Family Medicine

## 2018-03-10 DIAGNOSIS — Z1231 Encounter for screening mammogram for malignant neoplasm of breast: Secondary | ICD-10-CM

## 2018-03-19 ENCOUNTER — Ambulatory Visit (HOSPITAL_COMMUNITY)
Admission: RE | Admit: 2018-03-19 | Discharge: 2018-03-19 | Disposition: A | Discharge status: Home / Self Care | Payer: Managed Care, Other (non HMO) | Source: Ambulatory Visit | Attending: Family Medicine | Admitting: Family Medicine

## 2018-03-19 ENCOUNTER — Encounter (HOSPITAL_COMMUNITY): Payer: Self-pay

## 2018-03-19 ENCOUNTER — Other Ambulatory Visit: Payer: Self-pay | Admitting: Family Medicine

## 2018-03-19 DIAGNOSIS — Z1231 Encounter for screening mammogram for malignant neoplasm of breast: Secondary | ICD-10-CM | POA: Diagnosis not present

## 2018-03-30 ENCOUNTER — Other Ambulatory Visit: Payer: Self-pay | Admitting: Family Medicine

## 2018-03-30 DIAGNOSIS — Z1231 Encounter for screening mammogram for malignant neoplasm of breast: Secondary | ICD-10-CM

## 2018-03-30 DIAGNOSIS — R928 Other abnormal and inconclusive findings on diagnostic imaging of breast: Secondary | ICD-10-CM

## 2018-04-06 ENCOUNTER — Ambulatory Visit (HOSPITAL_COMMUNITY)
Admission: RE | Admit: 2018-04-06 | Discharge: 2018-04-06 | Disposition: A | Discharge status: Home / Self Care | Payer: Managed Care, Other (non HMO) | Source: Ambulatory Visit | Attending: Family Medicine | Admitting: Family Medicine

## 2018-04-06 ENCOUNTER — Encounter (HOSPITAL_COMMUNITY): Payer: Self-pay

## 2018-04-06 DIAGNOSIS — R928 Other abnormal and inconclusive findings on diagnostic imaging of breast: Secondary | ICD-10-CM

## 2018-06-28 ENCOUNTER — Encounter: Payer: Self-pay | Admitting: Family Medicine

## 2018-06-28 ENCOUNTER — Ambulatory Visit: Payer: Managed Care, Other (non HMO) | Admitting: Family Medicine

## 2018-06-28 VITALS — BP 114/77 | HR 101 | Temp 96.9°F | Ht 65.0 in | Wt 127.0 lb

## 2018-06-28 DIAGNOSIS — M797 Fibromyalgia: Secondary | ICD-10-CM

## 2018-06-28 DIAGNOSIS — E034 Atrophy of thyroid (acquired): Secondary | ICD-10-CM

## 2018-06-28 MED ORDER — DULOXETINE HCL 30 MG PO CPEP: 30.0000 mg | ORAL_CAPSULE | Freq: Every day | ORAL | 3 refills | Status: DC

## 2018-06-28 NOTE — Progress Notes (Signed)
BP 114/77   Pulse (!) 101   Temp (!) 96.9 F (36.1 C) (Oral)   Ht 5\' 5"  (1.651 m)   Wt 57.6 kg   BMI 21.13 kg/m    Subjective:    Patient ID: Shari Knox, female    DOB: 17-Jun-1971, 47 y.o.   MRN: [id number]  HPI: Shari CooleyKaren Knox is a 47 y.o. female presenting on 06/28/2018 for Hypothyroidism (3 month follow up)  Patient came in for thyroid medication recheck. Patient's Levothyroxine was recently increased to 75 mcg to better control her increased TSH and associated symptoms. At her last visit she was having palpitations and a racing heart. This has since resolved. She has not had any issues with heat or cold intolerance or hair loss.  Myalgia recheck Patient is also coming in for fibromyalgia recheck and she has currently been on Cymbalta and says that the Cymbalta still seems to be helping and she is very happy with where she is at.  She also feels like it has been helping her mood and she denies any major depression or anxiety.  She still had a few times where she is had the myalgias but it has been much better and she is very happy with it.  [facility] 06/28/2018 02/12/2018 10/14/2017 06/24/2017 01/09/2017  Decreased Interest 0 0 0 0 0  Down, Depressed, Hopeless 0 0 0 0 0  PHQ - 2 Score 0 0 0 0 0  Altered sleeping - - - - -  Tired, decreased energy - - - - -  Change in appetite - - - - -  Feeling bad or failure about yourself  - - - - -  Trouble concentrating - - - - -  Moving slowly or fidgety/restless - - - - -  Suicidal thoughts - - - - -  PHQ-9 Score - - - - -  Difficult doing work/chores - - - - -      Relevant past medical, surgical, family and social history reviewed and updated as indicated. Interim medical history since our last visit reviewed. Allergies and medications reviewed and updated.  Review of Systems  Constitutional: Negative for chills, diaphoresis and fever.  Respiratory: Negative for cough, chest tightness and shortness of breath.     Cardiovascular: Negative for chest pain and palpitations.  Gastrointestinal: Negative for abdominal pain.  Endocrine: Negative for cold intolerance and heat intolerance.  Musculoskeletal: Negative for arthralgias (controlled with Cymbalta).  Psychiatric/Behavioral: The patient is not nervous/anxious (has improved).     Per HPI unless specifically indicated above   Allergies as of 06/28/2018   No Known Allergies     Medication List        Accurate as of 06/28/18  4:13 PM. Always use your most recent med list.          acetaminophen 325 MG tablet Commonly known as:  TYLENOL Take 650 mg by mouth every 6 (six) hours as needed.   DULoxetine 30 MG capsule Commonly known as:  CYMBALTA Take 1 capsule (30 mg total) by mouth daily.   levothyroxine 75 MCG tablet Commonly known as:  SYNTHROID, LEVOTHROID Take 1 tablet (75 mcg total) by mouth daily.   valACYclovir 1000 MG tablet Commonly known as:  VALTREX TAKE 2 TABLETS TWICE DAILY AT ONSET OF FEVER BLISTER          Objective:    BP 114/77   Pulse (!) 101   Temp (!) 96.9 F (36.1 C) (Oral)  Ht 5\' 5"  (1.651 m)   Wt 57.6 kg   BMI 21.13 kg/m   Wt Readings from Last 3 Encounters:  06/28/18 57.6 kg  03/04/18 54.9 kg  02/12/18 56.7 kg    Physical Exam  Constitutional: She is oriented to person, place, and time. She appears well-developed and well-nourished. No distress.  Cardiovascular: Normal rate, regular rhythm, normal heart sounds and intact distal pulses.  Pulmonary/Chest: Effort normal and breath sounds normal.  Abdominal: Soft. There is no tenderness.  Neurological: She is alert and oriented to person, place, and time.  Skin: She is not diaphoretic.  Psychiatric: She has a normal mood and affect. Her behavior is normal.        Assessment & Plan:   Problem List Items Addressed This Visit      Endocrine   Hypothyroidism - Primary   Relevant Orders   TSH (Completed)    Other Visit Diagnoses     Fibromyalgia       Relevant Medications   DULoxetine (CYMBALTA) 30 MG capsule      Hypothyroidism Continue 75 mcg of Levothyroxine to control symptoms and thyroid levels. Follow up TSH ordered. Follow up in 6 months, unless the patient has any worsening symptoms.   Follow up plan: Return in about 6 months (around 12/27/2018), or if symptoms worsen or fail to improve, for Thyroid and fibromyalgia recheck.  Counseling provided for all of the vaccine components Orders Placed This Encounter  Procedures  . TSH   Patient seen and examined with Eli Hose, PA student.  Agree with assessment and plan above. Arville Care, MD [facility] 06/28/2018, 4:13 PM

## 2018-06-29 LAB — TSH: TSH: 2.01 u[IU]/mL (ref 0.450–4.500)

## 2018-07-07 ENCOUNTER — Other Ambulatory Visit: Payer: Self-pay | Admitting: Family Medicine

## 2018-07-07 DIAGNOSIS — B009 Herpesviral infection, unspecified: Secondary | ICD-10-CM

## 2018-08-23 ENCOUNTER — Other Ambulatory Visit: Payer: Self-pay | Admitting: Family Medicine

## 2018-09-19 IMAGING — MG DIGITAL SCREENING BILATERAL MAMMOGRAM WITH TOMO AND CAD
3 series · 3 of 7 positions shown · non-contrast
Comparison: Previous exam(s).

CLINICAL DATA: Screening.

EXAM:
DIGITAL SCREENING BILATERAL MAMMOGRAM WITH TOMO AND CAD

[R CC]
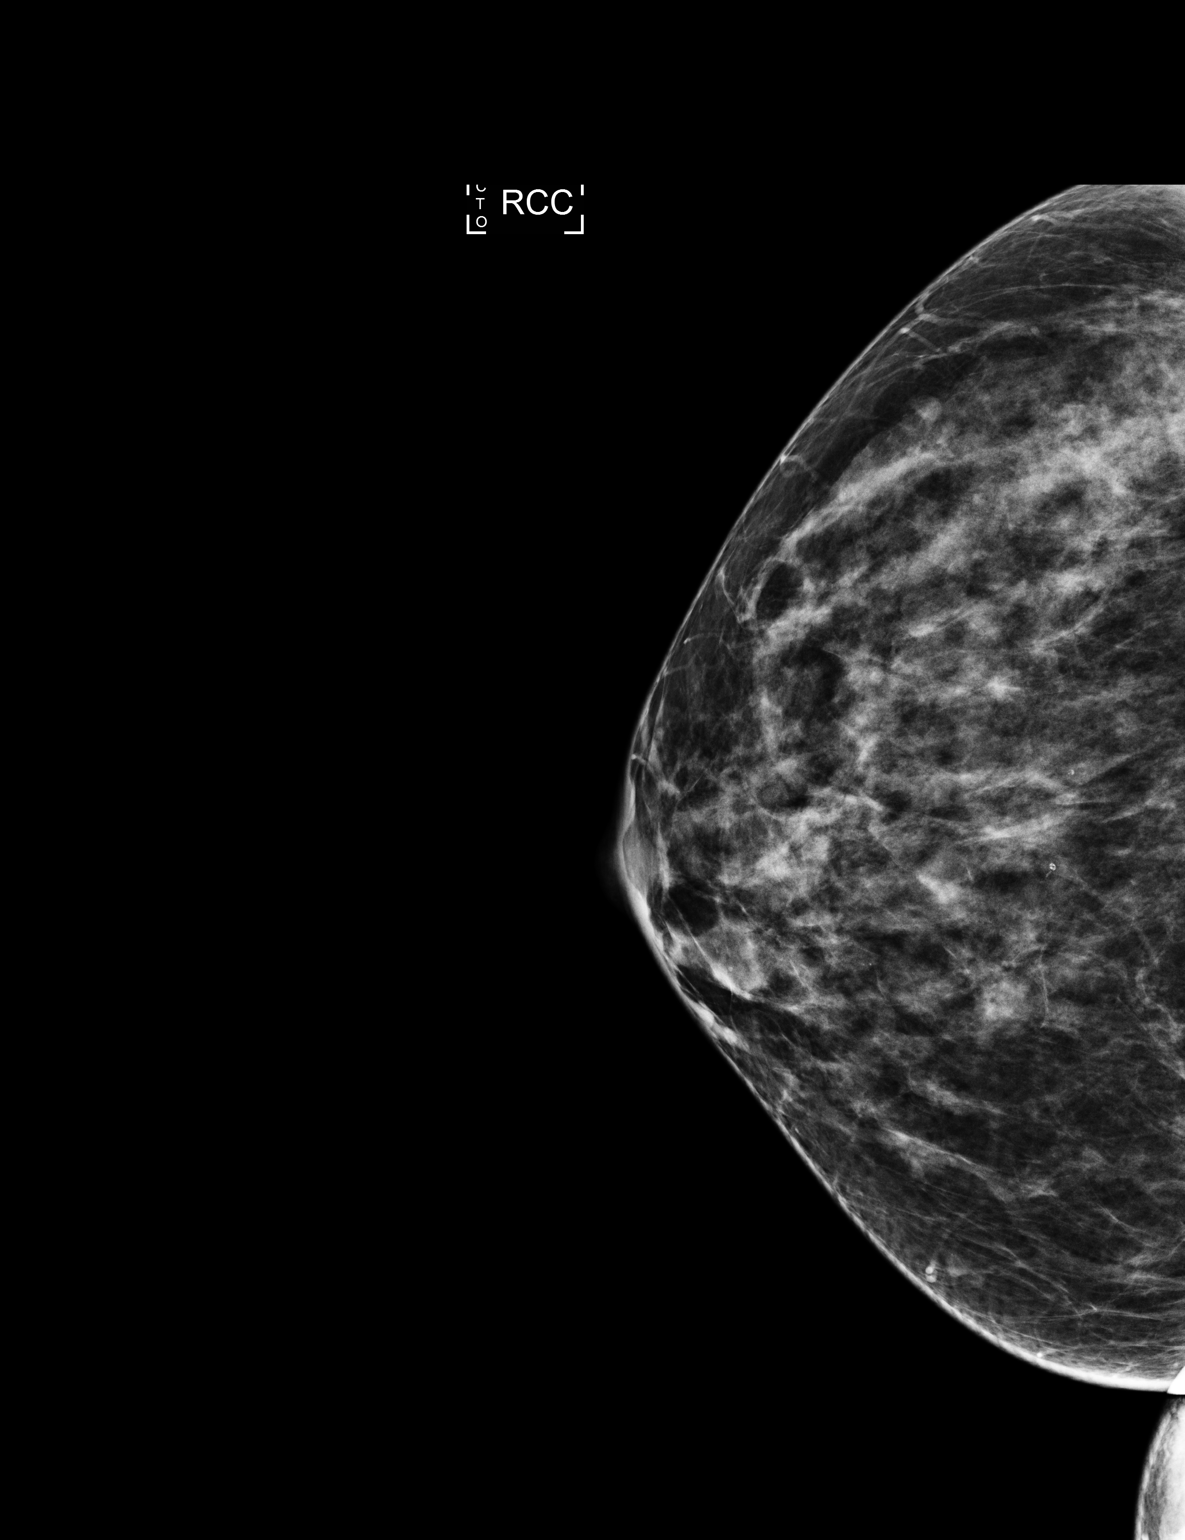

[L CC]
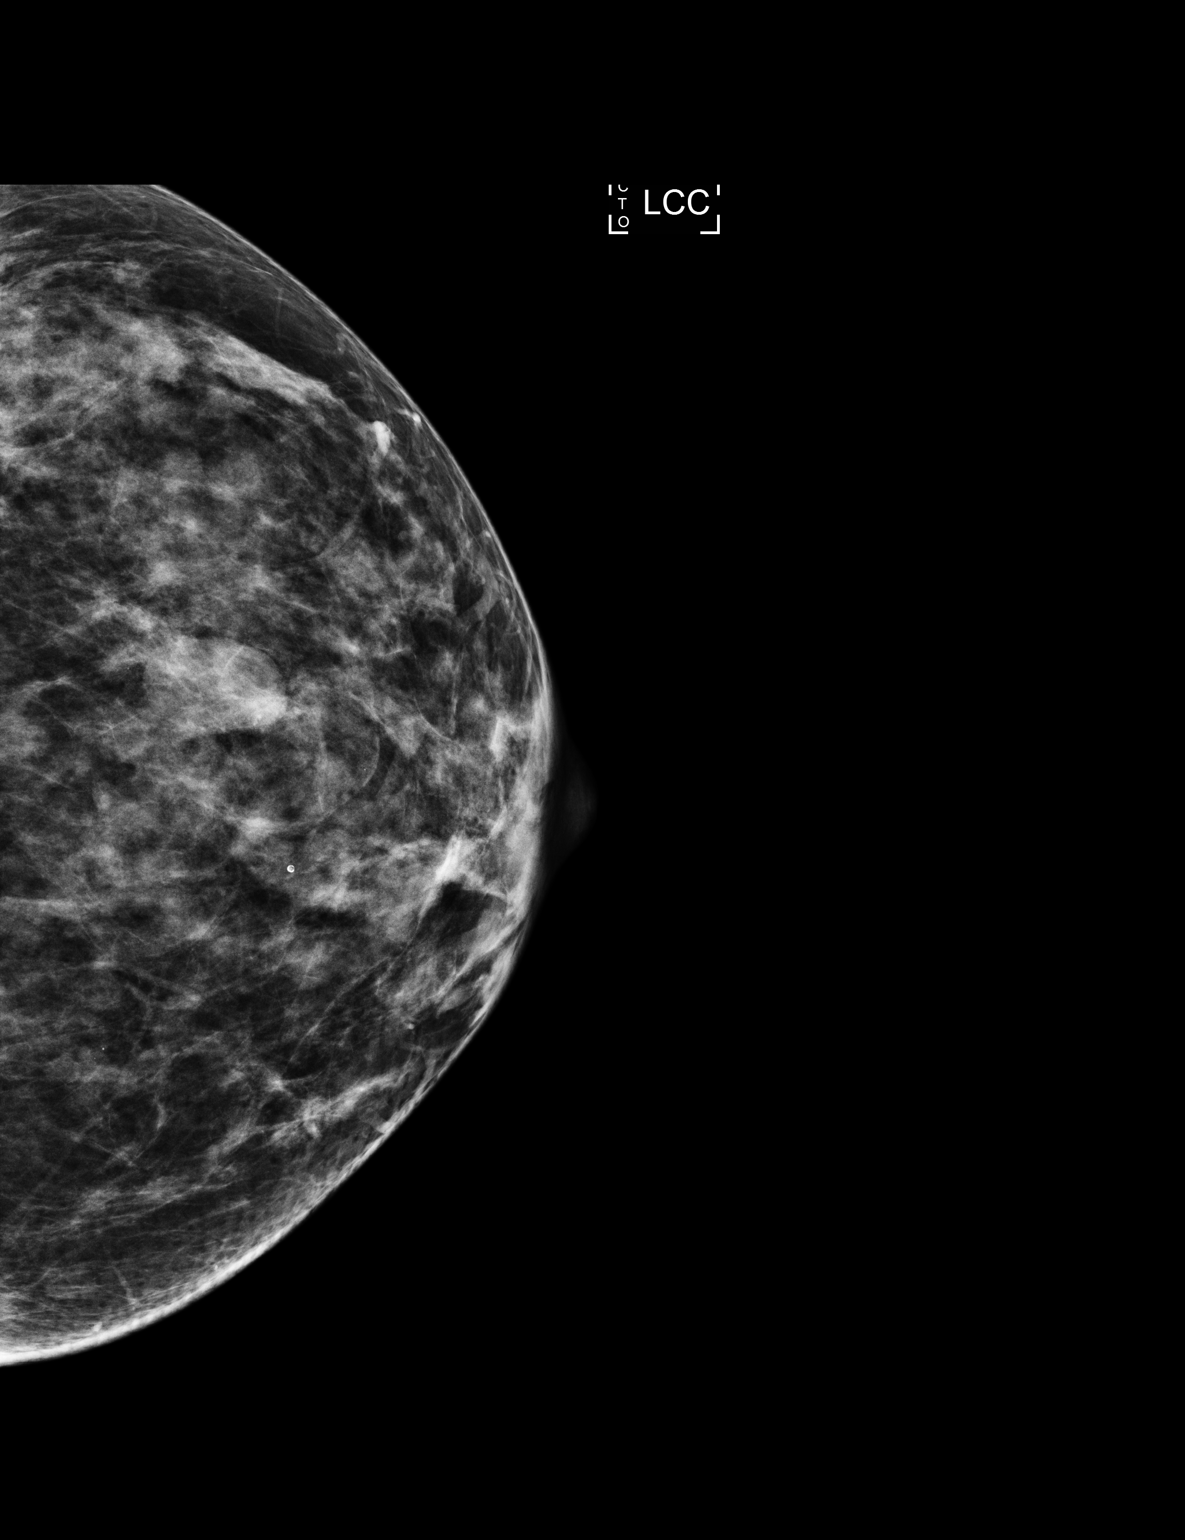

[L CC tomo · tomo slice 29/57.0]
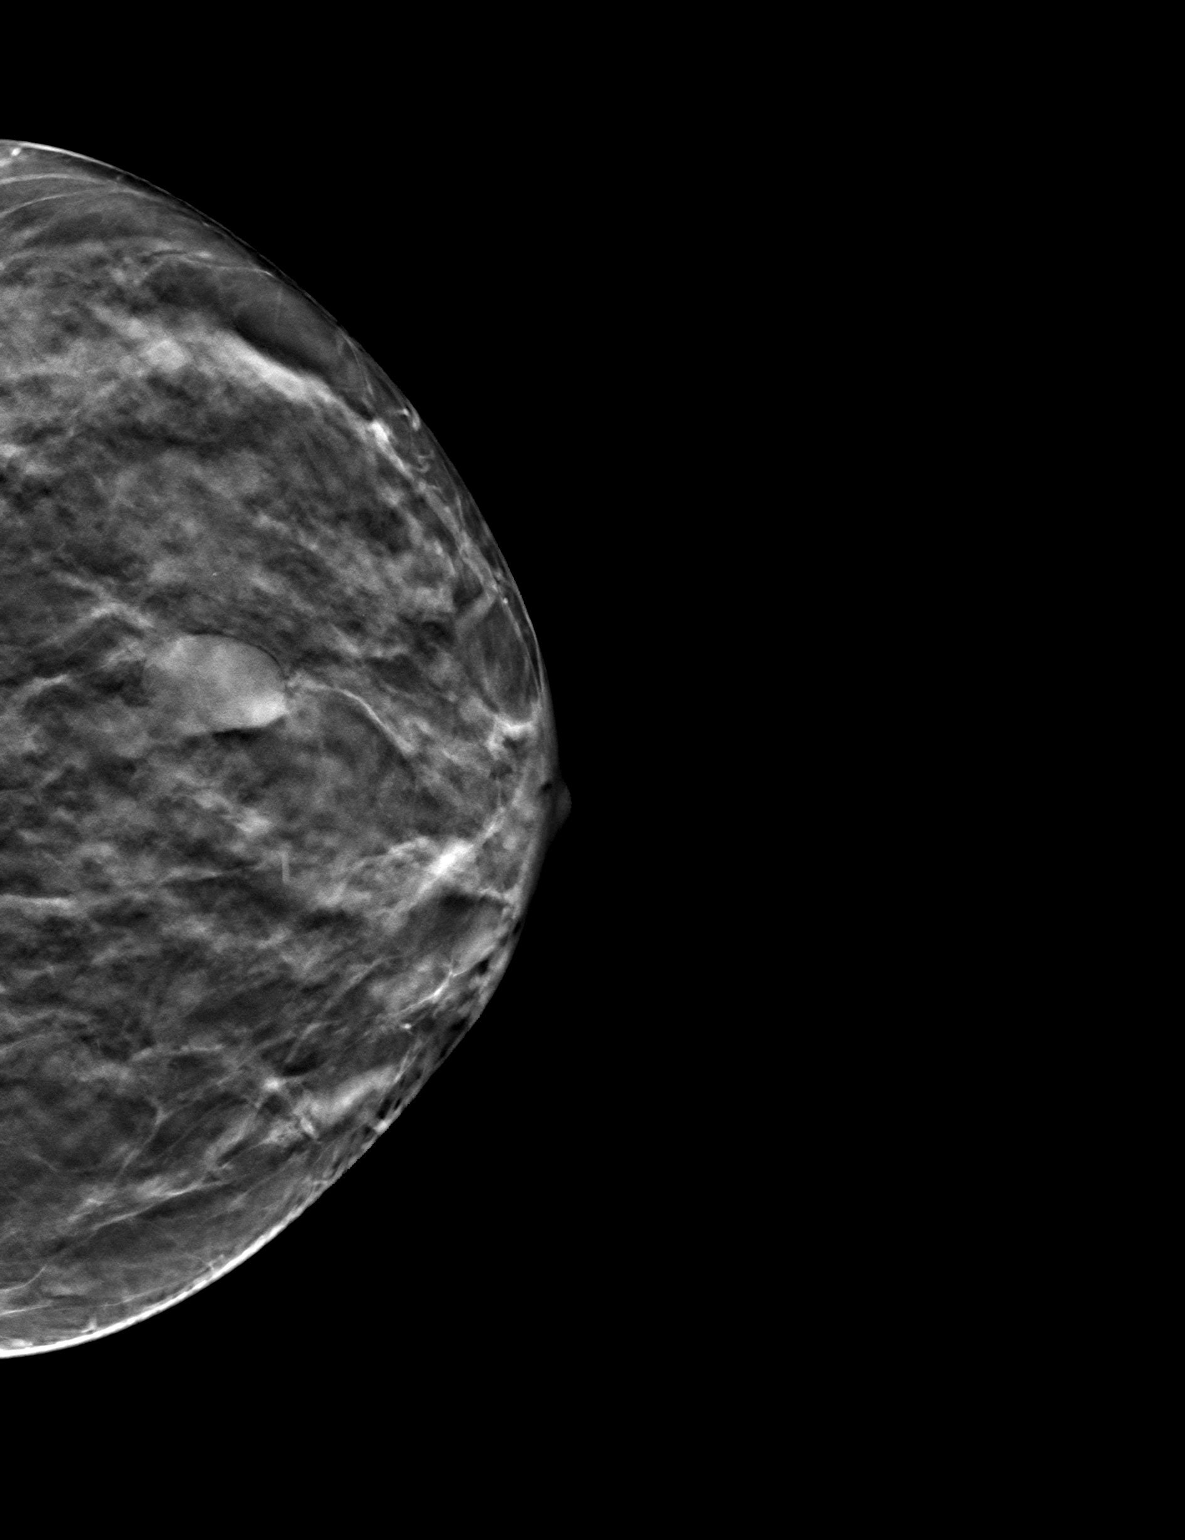

[3 of 7 positions shown; findings below may reference images not displayed]

ACR Breast Density Category c: The breast tissue is heterogeneously
dense, which may obscure small masses.
FINDINGS: In the left breast, 2 possible masses warrant further evaluation. In
the right breast, no findings suspicious for malignancy.

Images were processed with CAD.
IMPRESSION: Further evaluation is suggested for possible masses in the left
breast.

RECOMMENDATION:
Diagnostic mammogram and possibly ultrasound of the left breast.
(Code:[id number])

The patient will be contacted regarding the findings, and additional
imaging will be scheduled.

BI-RADS CATEGORY  0: Incomplete. Need additional imaging evaluation
and/or prior mammograms for comparison.

## 2018-10-07 IMAGING — MG DIGITAL DIAGNOSTIC UNILATERAL LEFT MAMMOGRAM WITH TOMO AND CAD
4 series · 4 of 12 positions shown · non-contrast
Comparison: Previous exam(s).

CLINICAL DATA: Screening recall for left breast masses.

EXAM:
DIGITAL DIAGNOSTIC UNILATERAL LEFT MAMMOGRAM WITH CAD AND TOMO
LEFT BREAST ULTRASOUND

[L MLO]
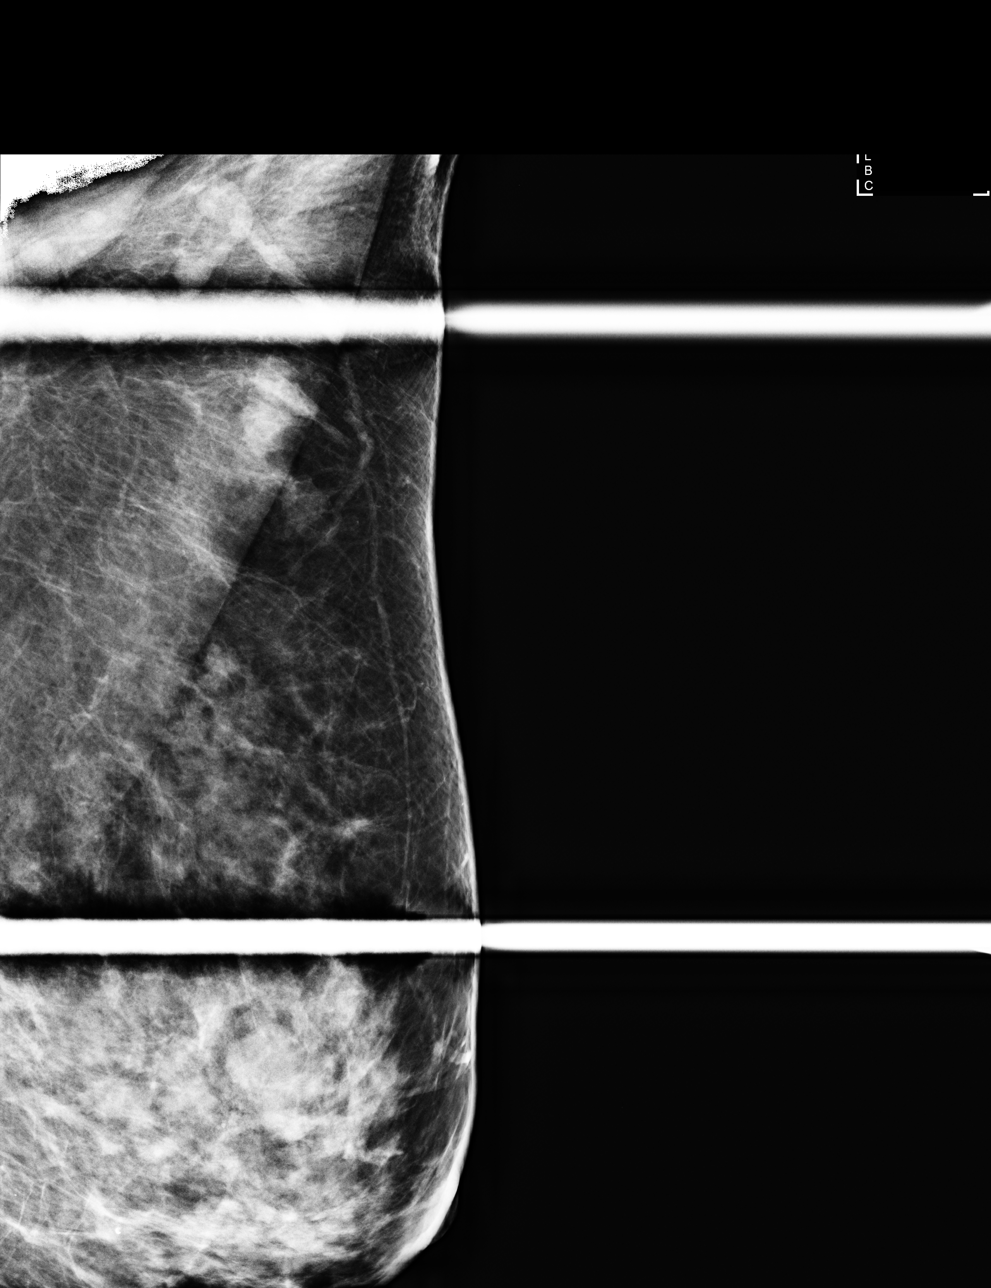

[L XCCL]
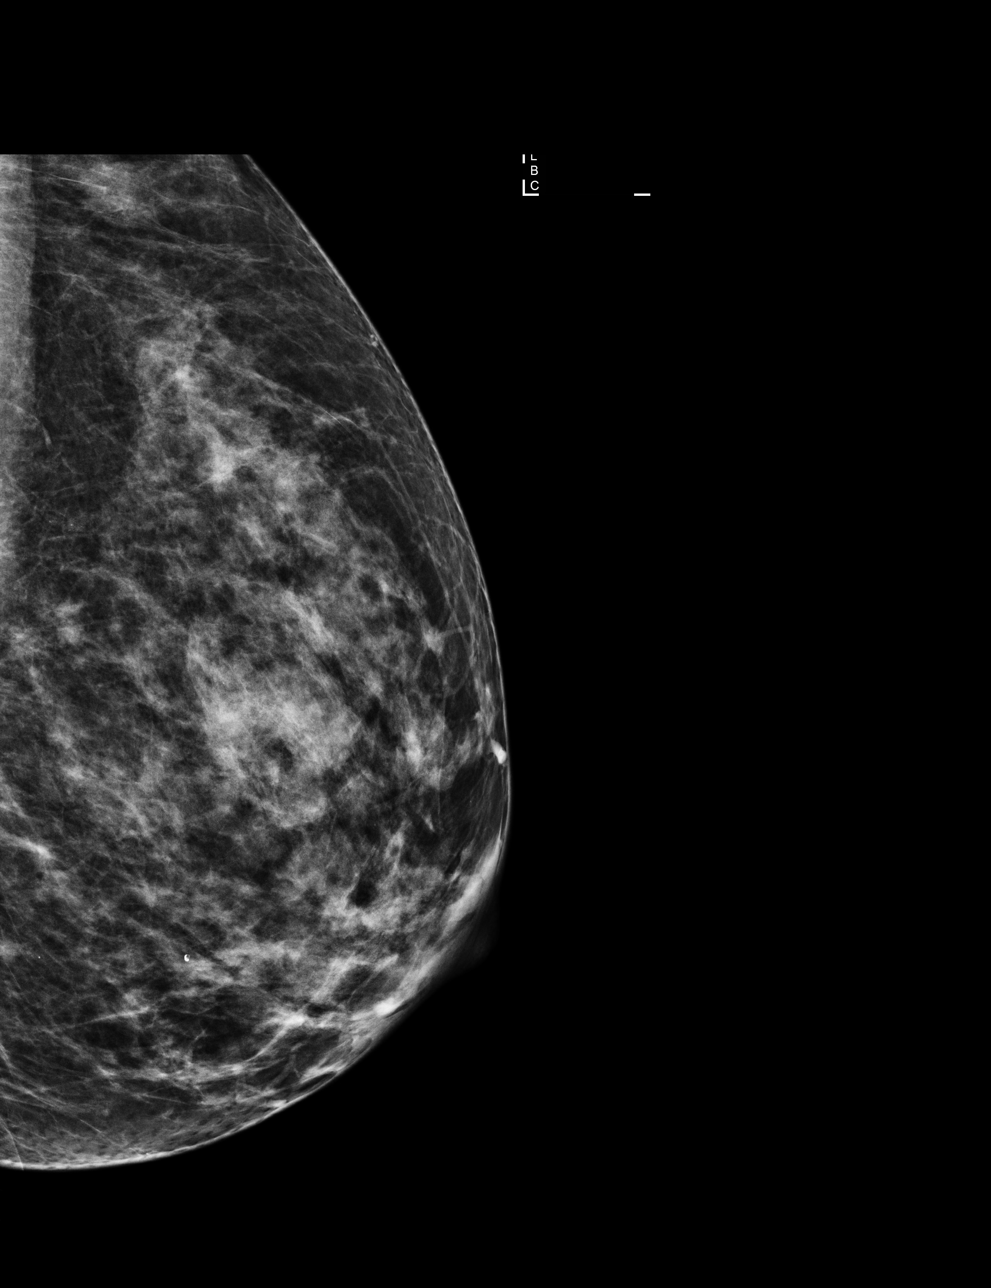

[L XCCL tomo · tomo slice 29/58.0]
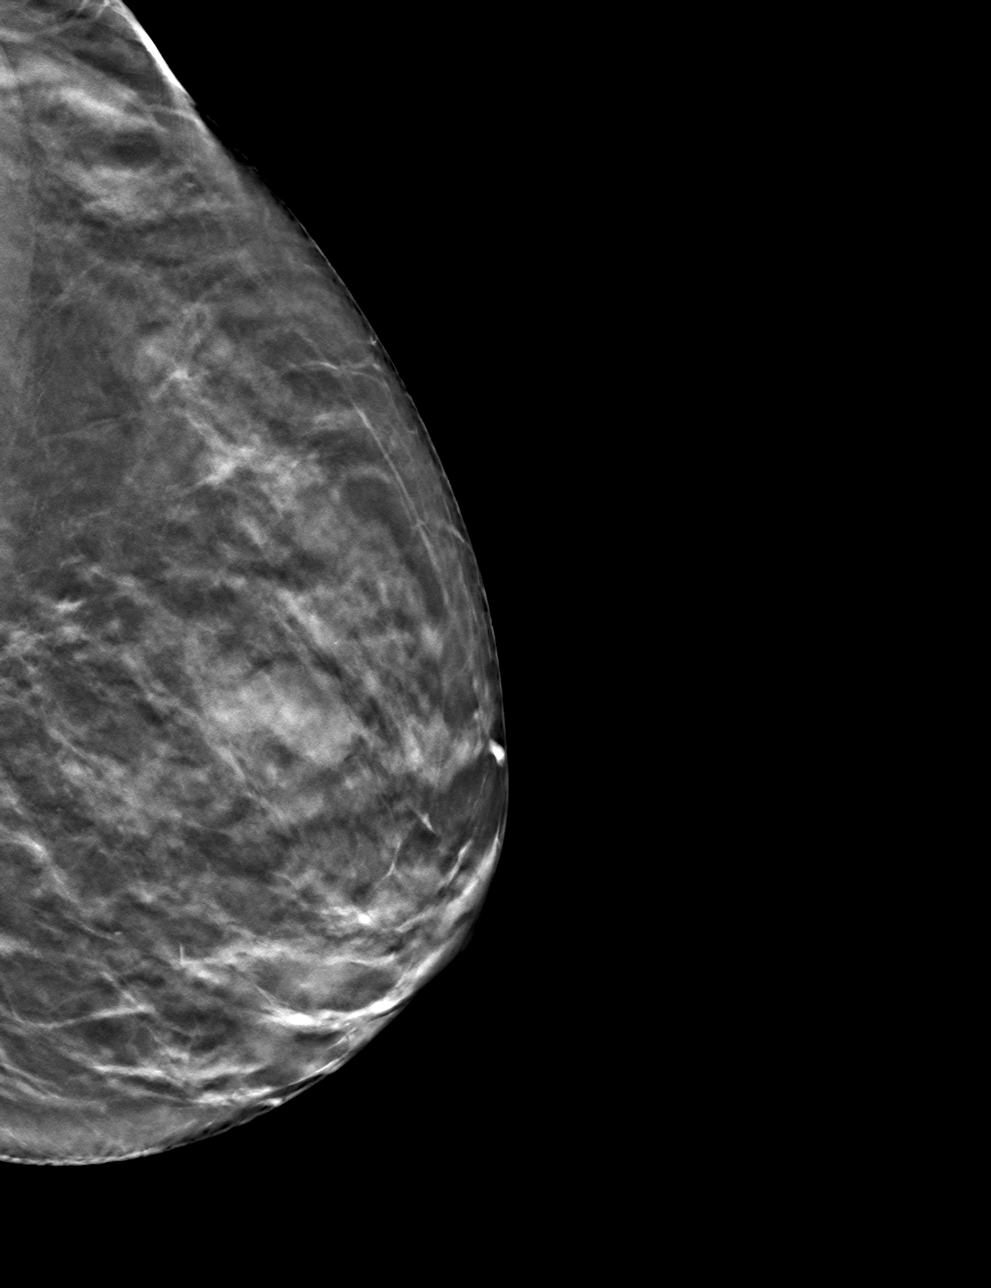

[L MLO tomo · tomo slice 25/50.0]
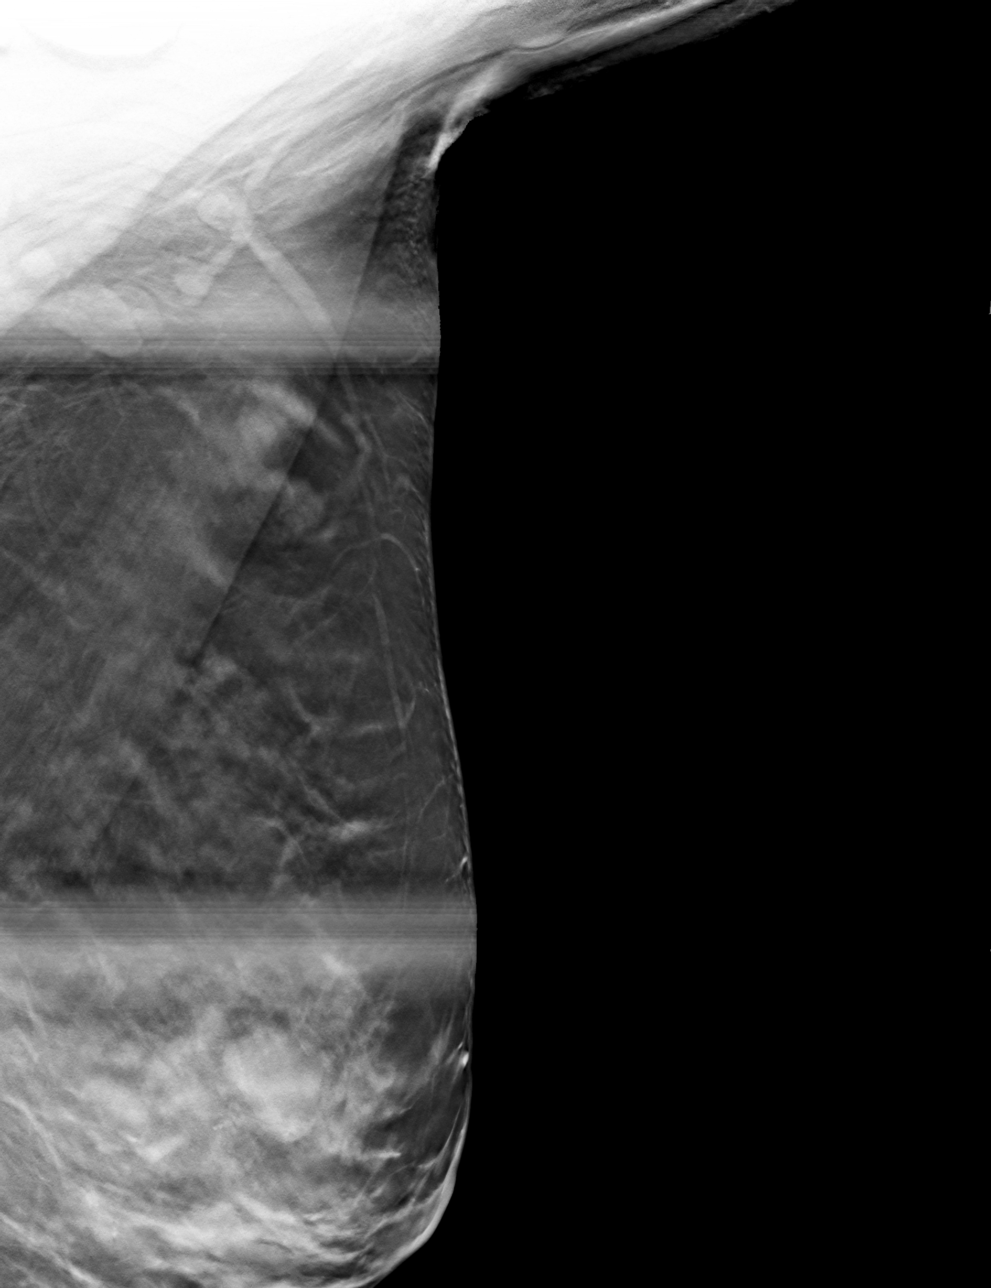

[4 of 12 positions shown; findings below may reference images not displayed]

ACR Breast Density Category c: The breast tissue is heterogeneously
dense, which may obscure small masses.
FINDINGS: There is an oval circumscribed mass in the upper central left breast
measuring approximately 1.9 cm. Spot compression MLO tomograms and
XCCL tomograms were performed over the far upper outer left breast
demonstrating a persistent asymmetry within the left axilla possibly
representing superimposed fibroglandular tissue and axillary lymph
nodes.

Mammographic images were processed with CAD.

Physical examination of the left axilla/axillary tail does not
reveal any palpable masses. Patient states she does experience
intermittent tenderness in her left axilla.

Targeted ultrasound of the left breast was performed demonstrating a
cyst at the [DATE] position 3 cm from nipple measuring 1.8 x 1.2 x
1.2 cm. This corresponds well with the mass seen in the upper
central left breast at mammography. Targeted ultrasound of the left
axilla/axillary tail was performed. No suspicious masses or
abnormality seen, only dense fibroglandular tissue and lymph nodes
of normal morphology identified. This dense fibroglandular tissue
likely accounts for the asymmetry seen in the left axilla at
mammography.
IMPRESSION: No findings of malignancy in the left breast.

RECOMMENDATION:
Recommend annual routine screening mammography, due April 2019.

I have discussed the findings and recommendations with the patient.
Results were also provided in writing at the conclusion of the
visit. If applicable, a reminder letter will be sent to the patient
regarding the next appointment.

BI-RADS CATEGORY  2: Benign.

## 2018-10-14 ENCOUNTER — Ambulatory Visit: Payer: Managed Care, Other (non HMO) | Admitting: Family

## 2018-10-14 ENCOUNTER — Encounter: Payer: Self-pay | Admitting: Family

## 2018-10-14 VITALS — BP 100/66 | HR 88 | Temp 97.0°F | Ht 65.0 in | Wt 127.8 lb

## 2018-10-14 DIAGNOSIS — K2970 Gastritis, unspecified, without bleeding: Secondary | ICD-10-CM | POA: Diagnosis not present

## 2018-10-14 DIAGNOSIS — J029 Acute pharyngitis, unspecified: Secondary | ICD-10-CM | POA: Diagnosis not present

## 2018-10-14 DIAGNOSIS — R6889 Other general symptoms and signs: Secondary | ICD-10-CM | POA: Diagnosis not present

## 2018-10-14 LAB — RAPID STREP SCREEN (MED CTR MEBANE ONLY): STREP GP A AG, IA W/REFLEX: NEGATIVE

## 2018-10-14 LAB — CULTURE, GROUP A STREP

## 2018-10-14 LAB — VERITOR FLU A/B WAIVED
INFLUENZA B: NEGATIVE
Influenza A: NEGATIVE

## 2018-10-14 MED ORDER — AMOXICILLIN 500 MG PO CAPS: 500.0000 mg | ORAL_CAPSULE | Freq: Two times a day (BID) | ORAL | 0 refills | Status: DC

## 2018-10-14 MED ORDER — ONDANSETRON 4 MG PO TBDP: 4.0000 mg | ORAL_TABLET | Freq: Three times a day (TID) | ORAL | 0 refills | Status: DC | PRN

## 2018-10-14 NOTE — Patient Instructions (Signed)
Viral Gastroenteritis, Adult    Viral gastroenteritis is also known as the stomach flu. This condition is caused by certain germs (viruses). These germs can be passed from person to person very easily (are very contagious). This condition can cause sudden watery poop (diarrhea), fever, and throwing up (vomiting).  Having watery poop and throwing up can make you feel weak and cause you to get dehydrated. Dehydration can make you tired and thirsty, make you have a dry mouth, and make it so you pee (urinate) less often. Older adults and people with other diseases or a weak defense system (immune system) are at higher risk for dehydration. It is important to replace the fluids that you lose from having watery poop and throwing up.  Follow these instructions at home:  Follow instructions from your doctor about how to care for yourself at home.  Eating and drinking  Follow these instructions as told by your doctor:   Take an oral rehydration solution (ORS). This is a drink that is sold at pharmacies and stores.   Drink clear fluids in small amounts as you are able, such as:  ? Water.  ? Ice chips.  ? Diluted fruit juice.  ? Low-calorie sports drinks.   Eat bland, easy-to-digest foods in small amounts as you are able, such as:  ? Bananas.  ? Applesauce.  ? Rice.  ? Low-fat (lean) meats.  ? Toast.  ? Crackers.   Avoid fluids that have a lot of sugar or caffeine in them.   Avoid alcohol.   Avoid spicy or fatty foods.  General instructions     Drink enough fluid to keep your pee (urine) clear or pale yellow.   Wash your hands often. If you cannot use soap and water, use hand sanitizer.   Make sure that all people in your home wash their hands well and often.   Rest at home while you get better.   Take over-the-counter and prescription medicines only as told by your doctor.   Watch your condition for any changes.   Take a warm bath to help with any burning or pain from having watery poop.   Keep all follow-up  visits as told by your doctor. This is important.  Contact a doctor if:   You cannot keep fluids down.   Your symptoms get worse.   You have new symptoms.   You feel light-headed or dizzy.   You have muscle cramps.  Get help right away if:   You have chest pain.   You feel very weak or you pass out (faint).   You see blood in your throw-up.   Your throw-up looks like coffee grounds.   You have bloody or black poop (stools) or poop that look like tar.   You have a very bad headache, a stiff neck, or both.   You have a rash.   You have very bad pain, cramping, or bloating in your belly (abdomen).   You have trouble breathing.   You are breathing very quickly.   Your heart is beating very quickly.   Your skin feels cold and clammy.   You feel confused.   You have pain when you pee.   You have signs of dehydration, such as:  ? Dark pee, hardly any pee, or no pee.  ? Cracked lips.  ? Dry mouth.  ? Sunken eyes.  ? Sleepiness.  ? Weakness.  This information is not intended to replace advice given to you by your   health care provider. Make sure you discuss any questions you have with your health care provider.  Document Released: 03/10/2008 Document Revised: 06/16/2018 Document Reviewed: 05/29/2015  Elsevier Interactive Patient Education  2019 [employer]

## 2018-10-14 NOTE — Progress Notes (Signed)
Subjective:    Patient ID: Shari Knox, female    DOB: 1971-08-07, 48 y.o.   MRN: [id number]  Chief Complaint  Patient presents with  . Sore Throat    no known fever, has been taking nyquil & dayquil  . Cough    since New Years  . Nausea    till midnight last night  . Generalized Body Aches  . Otalgia    ringing & earache    Sore Throat   This is a new problem. The current episode started 1 to 4 weeks ago. The problem has been waxing and waning. The pain is at a severity of 7/10. The pain is moderate. Associated symptoms include congestion, coughing, ear pain, headaches, swollen glands, trouble swallowing and vomiting. Pertinent negatives include no ear discharge or shortness of breath. She has tried acetaminophen for the symptoms. The treatment provided mild relief.  Cough  This is a new problem. The current episode started in the past 7 days. The problem has been gradually worsening. The problem occurs every few minutes. Associated symptoms include ear pain and headaches. Pertinent negatives include no shortness of breath.  Emesis   This is a new problem. The current episode started yesterday. The problem occurs 5 to 10 times per day. The problem has been gradually improving. Associated symptoms include arthralgias, coughing, dizziness and headaches.      Review of Systems  HENT: Positive for congestion, ear pain and trouble swallowing. Negative for ear discharge.   Respiratory: Positive for cough. Negative for shortness of breath.   Gastrointestinal: Positive for vomiting.  Musculoskeletal: Positive for arthralgias.  Neurological: Positive for dizziness and headaches.  All other systems reviewed and are negative.      Objective:   Physical Exam Vitals signs reviewed.  Constitutional:      General: She is not in acute distress.    Appearance: She is well-developed. She is ill-appearing.  HENT:     Head: Normocephalic and atraumatic.     Right Ear: External ear normal.       Nose: Mucosal edema present. No rhinorrhea.     Mouth/Throat:     Pharynx: Pharyngeal swelling, oropharyngeal exudate and posterior oropharyngeal erythema present.  Eyes:     Pupils: Pupils are equal, round, and reactive to light.  Neck:     Musculoskeletal: Normal range of motion and neck supple.     Thyroid: No thyromegaly.  Cardiovascular:     Rate and Rhythm: Normal rate and regular rhythm.     Heart sounds: Normal heart sounds. No murmur.  Pulmonary:     Effort: Pulmonary effort is normal. No respiratory distress.     Breath sounds: Normal breath sounds. No wheezing.  Abdominal:     General: Bowel sounds are normal. There is no distension.     Palpations: Abdomen is soft.     Tenderness: There is no abdominal tenderness.  Musculoskeletal: Normal range of motion.        General: No tenderness.  Skin:    General: Skin is warm and dry.  Neurological:     Mental Status: She is alert and oriented to person, place, and time.     Cranial Nerves: No cranial nerve deficit.     Deep Tendon Reflexes: Reflexes are normal and symmetric.  Psychiatric:        Behavior: Behavior normal.        Thought Content: Thought content normal.        Judgment: Judgment normal.  BP (!) 86/58   Pulse 89   Temp (!) 97 F (36.1 C) (Oral)   Ht 5\' 5"  (1.651 m)   Wt 127 lb 12.8 oz (58 kg)   BMI 21.27 kg/m      Assessment & Plan:  Shari Knox comes in today with chief complaint of Sore Throat (no known fever, has been taking nyquil & dayquil); Cough (since New Years); Nausea (till midnight last night); Generalized Body Aches; and Otalgia (ringing & earache)   Diagnosis and orders addressed:  1. Sore throat - Rapid Strep Screen ([facility] ONLY)  2. Flu-like symptoms - Veritor Flu A/B Waived  3. Acute pharyngitis, unspecified etiology -Use saline nose sprays frequently -Force fluids -For fever or aces or pains- take tylenol or ibuprofen. -Throat lozenges if help -New  toothbrush in 3 days - amoxicillin (AMOXIL) 500 MG capsule; Take 1 capsule (500 mg total) by mouth 2 (two) times daily.  Dispense: 14 capsule; Refill: 0  4. Viral gastritis Bland diet Force fluids Rest - ondansetron (ZOFRAN ODT) 4 MG disintegrating tablet; Take 1 tablet (4 mg total) by mouth every 8 (eight) hours as needed for nausea or vomiting.  Dispense: 20 tablet; Refill: 0   Jannifer Rodneyhristy Aneshia Jacquet, FNP

## 2018-11-10 ENCOUNTER — Other Ambulatory Visit: Payer: Self-pay | Admitting: Family Medicine

## 2018-11-10 DIAGNOSIS — B009 Herpesviral infection, unspecified: Secondary | ICD-10-CM

## 2018-11-29 ENCOUNTER — Telehealth: Payer: Self-pay | Admitting: Family Medicine

## 2018-11-29 NOTE — Telephone Encounter (Signed)
appt made

## 2018-11-30 ENCOUNTER — Ambulatory Visit: Payer: 59 | Admitting: Family Medicine

## 2018-12-01 ENCOUNTER — Ambulatory Visit: Payer: 59 | Admitting: Family Medicine

## 2018-12-01 VITALS — BP 126/81 | HR 95 | Temp 97.1°F | Ht 65.0 in | Wt 130.0 lb

## 2018-12-01 DIAGNOSIS — B029 Zoster without complications: Secondary | ICD-10-CM

## 2018-12-01 MED ORDER — VALACYCLOVIR HCL 1 G PO TABS: 1000.0000 mg | ORAL_TABLET | Freq: Three times a day (TID) | ORAL | 0 refills | Status: AC

## 2018-12-01 NOTE — Progress Notes (Signed)
Subjective: CC: ?shingles PCP: Dettinger, Elige Radon, MD NID:POEUM Rabadan is a 48 y.o. female presenting to clinic today for:  1. Rash Patient reports development of a rash on her left back last week.  She has noticed worsening pain and more vesicles forming over the last several days.  They extend from the left side of her back anteriorly underneath the left breast.  She has been applying calamine lotion with some relief of the pain but no resolution of the rash.  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  She has not taken any of that since the outbreak started.  She describes the pain as a burning pain.   ROS: Per HPI  No Known Allergies Past Medical History:  Diagnosis Date  . Anxiety   . Depression   . Herpes labialis   . Thyroid disease     Current Outpatient Medications:  .  acetaminophen (TYLENOL) 325 MG tablet, Take 650 mg by mouth every 6 (six) hours as needed., Disp: , Rfl:  .  DULoxetine (CYMBALTA) 30 MG capsule, Take 1 capsule (30 mg total) by mouth daily., Disp: 90 capsule, Rfl: 3 .  levothyroxine (SYNTHROID, LEVOTHROID) 75 MCG tablet, TAKE 1 TABLET DAILY, Disp: 30 tablet, Rfl: 9 .  valACYclovir (VALTREX) 1000 MG tablet, TAKE 2 TABLETS TWICE DAILY AT ONSET OF FEVER BLISTER, Disp: 12 tablet, Rfl: 2 Social History   Socioeconomic History  . Marital status: Married    Spouse name: Not on file  . Number of children: Not on file  . Years of education: Not on file  . Highest education level: Not on file  Occupational History  . Not on file  Social Needs  . Financial resource strain: Not on file  . Food insecurity:    Worry: Not on file    Inability: Not on file  . Transportation needs:    Medical: Not on file    Non-medical: Not on file  Tobacco Use  . Smoking status: Former Smoker    Last attempt to quit: 10/06/1996    Years since quitting: 22.1  . Smokeless tobacco: Never Used  Substance and Sexual Activity  . Alcohol use: No  . Drug use:  No  . Sexual activity: Not on file  Lifestyle  . Physical activity:    Days per week: Not on file    Minutes per session: Not on file  . Stress: Not on file  Relationships  . Social connections:    Talks on phone: Not on file    Gets together: Not on file    Attends religious service: Not on file    Active member of club or organization: Not on file    Attends meetings of clubs or organizations: Not on file    Relationship status: Not on file  . Intimate partner violence:    Fear of current or ex partner: Not on file    Emotionally abused: Not on file    Physically abused: Not on file    Forced sexual activity: Not on file  Other Topics Concern  . Not on file  Social History Narrative  . Not on file   Family History  Problem Relation Age of Onset  . Hypothyroidism Mother   . Cancer Mother        Breast  . Breast cancer Mother   . Heart disease Maternal Grandmother        Heart Bypass surgery  . Cancer Maternal  Grandfather        Lung    Objective: Office vital signs reviewed. BP 126/81   Pulse 95   Temp (!) 97.1 F (36.2 C) (Oral)   Ht 5\' 5"  (1.651 m)   Wt 130 lb (59 kg)   BMI 21.63 kg/m   Physical Examination:  General: Awake, alert, well nourished, No acute distress Skin: Patient with crusted over lesions noted near the left lateral aspect of the spine at about T8 level.  There is a separate set of clustered lesions appreciated further laterally on the left.  She has very small similar lesions beneath the left breast as well.  No active exudate but lesions are tender to palpation.  Assessment/ Plan: 48 y.o. female   1. Herpes zoster without complication Clinically consistent with a shingles outbreak.  I have placed her on Valtrex 3 times daily for 7 days.  Home care instructions were reviewed with the patient.  Okay to use capsaicin for pain.  We discussed consideration for gabapentin if she continues to have significant pain associated with rash.  She will  follow-up in the next 2 weeks with either myself or PCP for recheck. - valACYclovir (VALTREX) 1000 MG tablet; Take 1 tablet (1,000 mg total) by mouth 3 (three) times daily for 7 days.  Dispense: 21 tablet; Refill: 0   No orders of the defined types were placed in this encounter.  Meds ordered this encounter  Medications  . valACYclovir (VALTREX) 1000 MG tablet    Sig: Take 1 tablet (1,000 mg total) by mouth 3 (three) times daily for 7 days.    Dispense:  21 tablet    Refill:  0     Othel Dicostanzo Hulen Skains, DO [facility] [phone]

## 2018-12-01 NOTE — Patient Instructions (Signed)
Take the valtrex 3 times daily for 7 days.  A separate prescription has been sent in.  Follow up with myself or Dr Dettinger in 1-2 weeks for recheck if it is still hurting.  We can consider a medicine called Gabapentin if needed.  In the meantime, you can use capsaicin cream applied to the affected areas to help reduce pain.   Shingles  Shingles, which is also known as herpes zoster, is an infection that causes a painful skin rash and fluid-filled blisters. It is caused by a virus. Shingles only develops in people who:  Have had chickenpox.  Have been given a medicine to protect against chickenpox (have been vaccinated). Shingles is rare in this group. What are the causes? Shingles is caused by varicella-zoster virus (VZV). This is the same virus that causes chickenpox. After a person is exposed to VZV, the virus stays in the body in an inactive (dormant) state. Shingles develops if the virus is reactivated. This can happen many years after the first (initial) exposure to VZV. It is not known what causes this virus to be reactivated. What increases the risk? People who have had chickenpox or received the chickenpox vaccine are at risk for shingles. Shingles infection is more common in people who:  Are older than age 49.  Have a weakened disease-fighting system (immune system), such as people with: ? HIV. ? AIDS. ? Cancer.  Are taking medicines that weaken the immune system, such as transplant medicines.  Are experiencing a lot of stress. What are the signs or symptoms? Early symptoms of this condition include itching, tingling, and pain in an area on your skin. Pain may be described as burning, stabbing, or throbbing. A few days or weeks after early symptoms start, a painful red rash appears. The rash is usually on one side of the body and has a band-like or belt-like pattern. The rash eventually turns into fluid-filled blisters that break open, change into scabs, and dry up in about  2-3 weeks. At any time during the infection, you may also develop:  A fever.  Chills.  A headache.  An upset stomach. How is this diagnosed? This condition is diagnosed with a skin exam. Skin or fluid samples may be taken from the blisters before a diagnosis is made. These samples are examined under a microscope or sent to a lab for testing. How is this treated? The rash may last for several weeks. There is not a specific cure for this condition. Your health care provider will probably prescribe medicines to help you manage pain, recover more quickly, and avoid long-term problems. Medicines may include:  Antiviral drugs.  Anti-inflammatory drugs.  Pain medicines.  Anti-itching medicines (antihistamines). If the area involved is on your face, you may be referred to a specialist, such as an eye doctor (ophthalmologist) or an ear, nose, and throat (ENT) doctor (otolaryngologist) to help you avoid eye problems, chronic pain, or disability. Follow these instructions at home: Medicines  Take over-the-counter and prescription medicines only as told by your health care provider.  Apply an anti-itch cream or numbing cream to the affected area as told by your health care provider. Relieving itching and discomfort   Apply cold, wet cloths (cold compresses) to the area of the rash or blisters as told by your health care provider.  Cool baths can be soothing. Try adding baking soda or dry oatmeal to the water to reduce itching. Do not bathe in hot water. Blister and rash care  Keep your  rash covered with a loose bandage (dressing). Wear loose-fitting clothing to help ease the pain of material rubbing against the rash.  Keep your rash and blisters clean by washing the area with mild soap and cool water as told by your health care provider.  Check your rash every day for signs of infection. Check for: ? More redness, swelling, or pain. ? Fluid or blood. ? Warmth. ? Pus or a bad  smell.  Do not scratch your rash or pick at your blisters. To help avoid scratching: ? Keep your fingernails clean and cut short. ? Wear gloves or mittens while you sleep, if scratching is a problem. General instructions  Rest as told by your health care provider.  Keep all follow-up visits as told by your health care provider. This is important.  Wash your hands often with soap and water. If soap and water are not available, use hand sanitizer. Doing this lowers your chance of getting a bacterial skin infection.  Before your blisters change into scabs, your shingles infection can cause chickenpox in people who have never had it or have never been vaccinated against it. To prevent this from happening, avoid contact with other people, especially: ? Babies. ? Pregnant women. ? Children who have eczema. ? Elderly people who have transplants. ? People who have chronic illnesses, such as cancer or AIDS. Contact a health care provider if:  Your pain is not relieved with prescribed medicines.  Your pain does not get better after the rash heals.  You have signs of infection in the rash area, such as: ? More redness, swelling, or pain around the rash. ? Fluid or blood coming from the rash. ? The rash area feeling warm to the touch. ? Pus or a bad smell coming from the rash. Get help right away if:  The rash is on your face or nose.  You have facial pain, pain around your eye area, or loss of feeling on one side of your face.  You have difficulty seeing.  You have ear pain or have ringing in your ear.  You have a loss of taste.  Your condition gets worse. Summary  Shingles, which is also known as herpes zoster, is an infection that causes a painful skin rash and fluid-filled blisters.  This condition is diagnosed with a skin exam. Skin or fluid samples may be taken from the blisters and examined before the diagnosis is made.  Keep your rash covered with a loose bandage  (dressing). Wear loose-fitting clothing to help ease the pain of material rubbing against the rash.  Before your blisters change into scabs, your shingles infection can cause chickenpox in people who have never had it or have never been vaccinated against it. This information is not intended to replace advice given to you by your health care provider. Make sure you discuss any questions you have with your health care provider. Document Released: 09/22/2005 Document Revised: 05/27/2017 Document Reviewed: 05/27/2017 Elsevier Interactive Patient Education  2019 [employer].

## 2018-12-03 ENCOUNTER — Ambulatory Visit: Payer: 59 | Admitting: Family Medicine

## 2019-02-17 ENCOUNTER — Other Ambulatory Visit: Payer: Self-pay | Admitting: Family

## 2019-02-17 DIAGNOSIS — B009 Herpesviral infection, unspecified: Secondary | ICD-10-CM

## 2019-03-18 ENCOUNTER — Other Ambulatory Visit (HOSPITAL_COMMUNITY): Payer: Self-pay | Admitting: Family Medicine

## 2019-03-18 ENCOUNTER — Other Ambulatory Visit: Payer: Self-pay | Admitting: Family

## 2019-03-18 DIAGNOSIS — B009 Herpesviral infection, unspecified: Secondary | ICD-10-CM

## 2019-03-18 DIAGNOSIS — Z1231 Encounter for screening mammogram for malignant neoplasm of breast: Secondary | ICD-10-CM

## 2019-05-05 ENCOUNTER — Other Ambulatory Visit: Payer: Self-pay

## 2019-05-05 ENCOUNTER — Ambulatory Visit (HOSPITAL_COMMUNITY)
Admission: RE | Admit: 2019-05-05 | Discharge: 2019-05-05 | Disposition: A | Discharge status: Home / Self Care | Payer: 59 | Source: Ambulatory Visit | Attending: Family Medicine | Admitting: Family Medicine

## 2019-05-05 DIAGNOSIS — Z1231 Encounter for screening mammogram for malignant neoplasm of breast: Secondary | ICD-10-CM

## 2019-06-23 ENCOUNTER — Encounter: Payer: Self-pay | Admitting: *Deleted

## 2019-06-23 ENCOUNTER — Other Ambulatory Visit: Payer: Self-pay | Admitting: Family Medicine

## 2019-06-23 NOTE — Telephone Encounter (Signed)
Dettinger. NTBS last TSH 06/08/18

## 2019-06-23 NOTE — Telephone Encounter (Signed)
MyChart Message sent  

## 2019-06-24 ENCOUNTER — Telehealth: Payer: Self-pay | Admitting: Family Medicine

## 2019-06-24 MED ORDER — LEVOTHYROXINE SODIUM 75 MCG PO TABS: 75.0000 ug | ORAL_TABLET | Freq: Every day | ORAL | 0 refills | Status: DC

## 2019-06-24 NOTE — Telephone Encounter (Signed)
Left message stating refill has been sent to pharmacy and will need to be seen for any further refills.

## 2019-06-24 NOTE — Telephone Encounter (Signed)
Last seen 06/29/19

## 2019-06-24 NOTE — Telephone Encounter (Signed)
What is the name of the medication? levothyroxine (SYNTHROID) 75 MCG tablet   Have you contacted your pharmacy to request a refill? Yes but ntbs  Which pharmacy would you like this sent to? [facility] has apt with Dettinger 06/30/2019 but today she took her last pill. Can a refill be called in till apt. Dettinger does not have anything sooner.   Patient notified that their request is being sent to the clinical staff for review and that they should receive a call once it is complete. If they do not receive a call within 24 hours they can check with their pharmacy or our office.

## 2019-06-24 NOTE — Telephone Encounter (Signed)
I sent in 1 month with no refills for the patient, we will do her testing when she comes in for her appointment and then we will see if we have to adjust from there.

## 2019-06-30 ENCOUNTER — Encounter: Payer: Self-pay | Admitting: Family Medicine

## 2019-06-30 ENCOUNTER — Ambulatory Visit (INDEPENDENT_AMBULATORY_CARE_PROVIDER_SITE_OTHER): Payer: Managed Care, Other (non HMO) | Admitting: Family Medicine

## 2019-06-30 DIAGNOSIS — M797 Fibromyalgia: Secondary | ICD-10-CM | POA: Diagnosis not present

## 2019-06-30 DIAGNOSIS — E034 Atrophy of thyroid (acquired): Secondary | ICD-10-CM

## 2019-06-30 DIAGNOSIS — F411 Generalized anxiety disorder: Secondary | ICD-10-CM | POA: Diagnosis not present

## 2019-06-30 DIAGNOSIS — F3341 Major depressive disorder, recurrent, in partial remission: Secondary | ICD-10-CM | POA: Diagnosis not present

## 2019-06-30 DIAGNOSIS — B009 Herpesviral infection, unspecified: Secondary | ICD-10-CM

## 2019-06-30 DIAGNOSIS — Z131 Encounter for screening for diabetes mellitus: Secondary | ICD-10-CM

## 2019-06-30 DIAGNOSIS — Z13220 Encounter for screening for lipoid disorders: Secondary | ICD-10-CM

## 2019-06-30 MED ORDER — DULOXETINE HCL 30 MG PO CPEP: 30.0000 mg | ORAL_CAPSULE | Freq: Every day | ORAL | 3 refills | Status: DC

## 2019-06-30 MED ORDER — VALACYCLOVIR HCL 1 G PO TABS: 1000.0000 mg | ORAL_TABLET | Freq: Two times a day (BID) | ORAL | 2 refills | Status: DC

## 2019-06-30 NOTE — Progress Notes (Signed)
Virtual Visit via telephone Note  I connected with Shari Knox on 06/30/19 at 0821 by telephone and verified that I am speaking with the correct person using two identifiers. Shari Knox is currently located at home and no other people are currently with her during visit. The provider, Fransisca Kaufmann Lydiana Milley, MD is located in their office at time of visit.  Call ended at [phone]  I discussed the limitations, risks, security and privacy concerns of performing an evaluation and management service by telephone and the availability of in person appointments. I also discussed with the patient that there may be a patient responsible charge related to this service. The patient expressed understanding and agreed to proceed.   History and Present Illness: Hypothyroidism recheck Patient is coming in for thyroid recheck today as well. They deny any issues with hair changes or heat or cold problems or diarrhea or constipation. They deny any chest pain or palpitations. They are currently on levothyroxine 40mcrograms.  Patient is more fatigued but she is also been working double shifts and working every night on third shift and not in any time off.  Anxiety and myalgias Patient is calling in for a recheck of anxiety and myalgias and is taking Cymbalta and she has occasionally taken two but she feels like she doing well right now.  She denies any major mood disorder  No diagnosis found.  Outpatient Encounter Medications as of 06/30/2019  Medication Sig  . acetaminophen (TYLENOL) 325 MG tablet Take 650 mg by mouth every 6 (six) hours as needed.  . DULoxetine (CYMBALTA) 30 MG capsule Take 1 capsule (30 mg total) by mouth daily.  .Marland Kitchenlevothyroxine (SYNTHROID) 75 MCG tablet Take 1 tablet (75 mcg total) by mouth daily.  . valACYclovir (VALTREX) 1000 MG tablet TAKE 2 TABLETS TWICE DAILY AT ONSET OF FEVER BLISTER   No facility-administered encounter medications on file as of 06/30/2019.     Review of Systems   Constitutional: Positive for fatigue. Negative for chills and fever.  HENT: Negative for congestion, ear discharge and ear pain.   Eyes: Negative for redness and visual disturbance.  Respiratory: Negative for chest tightness and shortness of breath.   Cardiovascular: Negative for chest pain and leg swelling.  Musculoskeletal: Negative for back pain and gait problem.  Skin: Negative for rash.  Neurological: Negative for light-headedness and headaches.  Psychiatric/Behavioral: Negative for agitation and behavioral problems.  All other systems reviewed and are negative.   Observations/Objective: Patient sounds comfortable and in no acute distress  Assessment and Plan: Problem List Items Addressed This Visit      Endocrine   Hypothyroidism - Primary   Relevant Orders   TSH     Other   Depression   Relevant Medications   DULoxetine (CYMBALTA) 30 MG capsule   HSV-1 (herpes simplex virus 1) infection   Relevant Medications   valACYclovir (VALTREX) 1000 MG tablet   GAD (generalized anxiety disorder)   Relevant Medications   DULoxetine (CYMBALTA) 30 MG capsule    Other Visit Diagnoses    Fibromyalgia       Relevant Medications   DULoxetine (CYMBALTA) 30 MG capsule   Other Relevant Orders   CBC with Differential/Platelet   CMP14+EGFR   Lipid screening       Relevant Orders   Lipid panel   Diabetes mellitus screening       Relevant Orders   CMP14+EGFR       Follow Up Instructions: Follow-up in 6 months unless thyroid is  off and we need to be seen sooner  We will await the refill thyroid and till we get the blood work back to see if we need to adjust the dose.    I discussed the assessment and treatment plan with the patient. The patient was provided an opportunity to ask questions and all were answered. The patient agreed with the plan and demonstrated an understanding of the instructions.   The patient was advised to call back or seek an in-person evaluation if the  symptoms worsen or if the condition fails to improve as anticipated.  The above assessment and management plan was discussed with the patient. The patient verbalized understanding of and has agreed to the management plan. Patient is aware to call the clinic if symptoms persist or worsen. Patient is aware when to return to the clinic for a follow-up visit. Patient educated on when it is appropriate to go to the emergency department.    I provided 12 minutes of non-face-to-face time during this encounter.    Worthy Rancher, MD

## 2019-07-01 ENCOUNTER — Other Ambulatory Visit: Payer: Managed Care, Other (non HMO)

## 2019-07-01 ENCOUNTER — Other Ambulatory Visit: Payer: Self-pay

## 2019-07-01 DIAGNOSIS — M797 Fibromyalgia: Secondary | ICD-10-CM

## 2019-07-01 DIAGNOSIS — Z131 Encounter for screening for diabetes mellitus: Secondary | ICD-10-CM

## 2019-07-01 DIAGNOSIS — Z13220 Encounter for screening for lipoid disorders: Secondary | ICD-10-CM

## 2019-07-01 DIAGNOSIS — E034 Atrophy of thyroid (acquired): Secondary | ICD-10-CM

## 2019-07-02 LAB — CBC WITH DIFFERENTIAL/PLATELET
Basophils Absolute: 0.1 10*3/uL (ref 0.0–0.2)
Basos: 1 %
EOS (ABSOLUTE): 0.4 10*3/uL (ref 0.0–0.4)
Eos: 6 %
Hematocrit: 35.4 % (ref 34.0–46.6)
Hemoglobin: 11.5 g/dL (ref 11.1–15.9)
Immature Grans (Abs): 0 10*3/uL (ref 0.0–0.1)
Immature Granulocytes: 1 %
Lymphocytes Absolute: 1.7 10*3/uL (ref 0.7–3.1)
Lymphs: 28 %
MCH: 28.8 pg (ref 26.6–33.0)
MCHC: 32.5 g/dL (ref 31.5–35.7)
MCV: 89 fL (ref 79–97)
Monocytes Absolute: 0.4 10*3/uL (ref 0.1–0.9)
Monocytes: 7 %
Neutrophils Absolute: 3.6 10*3/uL (ref 1.4–7.0)
Neutrophils: 57 %
Platelets: 293 10*3/uL (ref 150–450)
RBC: 4 x10E6/uL (ref 3.77–5.28)
RDW: 12.3 % (ref 11.7–15.4)
WBC: 6.2 10*3/uL (ref 3.4–10.8)

## 2019-07-02 LAB — CMP14+EGFR
ALT: 14 IU/L (ref 0–32)
AST: 23 IU/L (ref 0–40)
Albumin/Globulin Ratio: 1.6 (ref 1.2–2.2)
Albumin: 4.1 g/dL (ref 3.8–4.8)
Alkaline Phosphatase: 58 IU/L (ref 39–117)
BUN/Creatinine Ratio: 16 (ref 9–23)
BUN: 10 mg/dL (ref 6–24)
Bilirubin Total: <0.2 mg/dL (ref 0.0–1.2)
CO2: 26 mmol/L (ref 20–29)
Calcium: 9.1 mg/dL (ref 8.7–10.2)
Chloride: 101 mmol/L (ref 96–106)
Creatinine, Ser: 0.63 mg/dL (ref 0.57–1.00)
GFR calc Af Amer: 123 mL/min/{1.73_m2} (ref >59)
GFR calc non Af Amer: 106 mL/min/{1.73_m2} (ref >59)
Globulin, Total: 2.5 g/dL (ref 1.5–4.5)
Glucose: 83 mg/dL (ref 65–99)
Potassium: 4 mmol/L (ref 3.5–5.2)
Sodium: 139 mmol/L (ref 134–144)
Total Protein: 6.6 g/dL (ref 6.0–8.5)

## 2019-07-02 LAB — LIPID PANEL
Chol/HDL Ratio: 2.8 ratio (ref 0.0–4.4)
Cholesterol, Total: 133 mg/dL (ref 100–199)
HDL: 48 mg/dL (ref >39)
LDL Chol Calc (NIH): 71 mg/dL (ref 0–99)
Triglycerides: 69 mg/dL (ref 0–149)
VLDL Cholesterol Cal: 14 mg/dL (ref 5–40)

## 2019-07-02 LAB — TSH: TSH: 3.55 u[IU]/mL (ref 0.450–4.500)

## 2019-07-22 ENCOUNTER — Other Ambulatory Visit: Payer: Self-pay | Admitting: Family Medicine

## 2020-04-13 ENCOUNTER — Other Ambulatory Visit (HOSPITAL_COMMUNITY): Payer: Self-pay | Admitting: Family Medicine

## 2020-04-13 DIAGNOSIS — Z1231 Encounter for screening mammogram for malignant neoplasm of breast: Secondary | ICD-10-CM

## 2020-05-07 ENCOUNTER — Other Ambulatory Visit: Payer: Self-pay

## 2020-05-07 ENCOUNTER — Ambulatory Visit (HOSPITAL_COMMUNITY)
Admission: RE | Admit: 2020-05-07 | Discharge: 2020-05-07 | Disposition: A | Discharge status: Home / Self Care | Payer: 59 | Source: Ambulatory Visit | Attending: Family Medicine | Admitting: Family Medicine

## 2020-05-07 DIAGNOSIS — Z1231 Encounter for screening mammogram for malignant neoplasm of breast: Secondary | ICD-10-CM | POA: Diagnosis present

## 2020-05-09 ENCOUNTER — Other Ambulatory Visit (HOSPITAL_COMMUNITY): Payer: Self-pay | Admitting: Family Medicine

## 2020-05-09 DIAGNOSIS — R928 Other abnormal and inconclusive findings on diagnostic imaging of breast: Secondary | ICD-10-CM

## 2020-05-24 ENCOUNTER — Ambulatory Visit (HOSPITAL_COMMUNITY)
Admission: RE | Admit: 2020-05-24 | Discharge: 2020-05-24 | Disposition: A | Discharge status: Home / Self Care | Payer: 59 | Source: Ambulatory Visit | Attending: Family Medicine | Admitting: Family Medicine

## 2020-05-24 ENCOUNTER — Other Ambulatory Visit: Payer: Self-pay

## 2020-05-24 DIAGNOSIS — R928 Other abnormal and inconclusive findings on diagnostic imaging of breast: Secondary | ICD-10-CM | POA: Insufficient documentation

## 2020-06-04 ENCOUNTER — Ambulatory Visit: Payer: 59 | Admitting: Family Medicine

## 2020-06-04 ENCOUNTER — Other Ambulatory Visit: Payer: Self-pay

## 2020-06-04 ENCOUNTER — Encounter: Payer: Self-pay | Admitting: Family Medicine

## 2020-06-04 VITALS — BP 103/74 | HR 89 | Temp 97.6°F | Ht 65.0 in | Wt 129.0 lb

## 2020-06-04 DIAGNOSIS — M797 Fibromyalgia: Secondary | ICD-10-CM

## 2020-06-04 DIAGNOSIS — B009 Herpesviral infection, unspecified: Secondary | ICD-10-CM | POA: Diagnosis not present

## 2020-06-04 DIAGNOSIS — Z13220 Encounter for screening for lipoid disorders: Secondary | ICD-10-CM

## 2020-06-04 DIAGNOSIS — E034 Atrophy of thyroid (acquired): Secondary | ICD-10-CM

## 2020-06-04 DIAGNOSIS — F3341 Major depressive disorder, recurrent, in partial remission: Secondary | ICD-10-CM

## 2020-06-04 DIAGNOSIS — Z1159 Encounter for screening for other viral diseases: Secondary | ICD-10-CM

## 2020-06-04 DIAGNOSIS — F411 Generalized anxiety disorder: Secondary | ICD-10-CM

## 2020-06-04 MED ORDER — DULOXETINE HCL 30 MG PO CPEP: 30.0000 mg | ORAL_CAPSULE | Freq: Every day | ORAL | 3 refills | Status: DC

## 2020-06-04 MED ORDER — LEVOTHYROXINE SODIUM 75 MCG PO TABS
75.0000 ug | ORAL_TABLET | Freq: Every day | ORAL | 3 refills | Status: DC
Start: 2020-06-04 — End: 2021-05-21

## 2020-06-04 MED ORDER — VALACYCLOVIR HCL 1 G PO TABS: 1000.0000 mg | ORAL_TABLET | Freq: Two times a day (BID) | ORAL | 2 refills | Status: DC

## 2020-06-04 NOTE — Progress Notes (Signed)
BP 103/74   Pulse 89   Temp 97.6 F (36.4 C)   Ht 5' 5"  (1.651 m)   Wt 129 lb (58.5 kg)   SpO2 97%   BMI 21.47 kg/m    Subjective:   Patient ID: Shari Knox, female    DOB: 04-21-1971, 49 y.o.   MRN: [id number]  HPI: Shari Knox is a 49 y.o. female presenting on 06/04/2020 for Medical Management of Chronic Issues and Hypothyroidism   HPI Anxiety depression and fibromyalgia recheck Patient says is been a tough year with her mother going through lung cancer and her husband being called away for a job in [place] for a year and dealing with other difficulties and stressors with Covid.  She says there is been a few times where for a few days she will double up on her Cymbalta but most of the time she feels like is doing very well and she has been very happy with it and has been controlling.  Patient denies any suicidal ideations or thoughts of hurting her self.  Hypothyroidism recheck Patient is coming in for thyroid recheck today as well. They deny any issues with hair changes or heat or cold problems or diarrhea or constipation. They deny any chest pain or palpitations. They are currently on levothyroxine 75 micrograms   Patient uses Valtrex on hand for when she gets cold sores and it works when she needs it.  Relevant past medical, surgical, family and social history reviewed and updated as indicated. Interim medical history since our last visit reviewed. Allergies and medications reviewed and updated.  Review of Systems  Constitutional: Negative for chills and fever.  Eyes: Negative for visual disturbance.  Respiratory: Negative for chest tightness and shortness of breath.   Cardiovascular: Negative for chest pain and leg swelling.  Endocrine: Negative for cold intolerance and heat intolerance.  Musculoskeletal: Positive for myalgias. Negative for back pain and gait problem.  Skin: Negative for rash.  Neurological: Negative for light-headedness and headaches.    Psychiatric/Behavioral: Positive for dysphoric mood. Negative for agitation, behavioral problems, decreased concentration, self-injury, sleep disturbance and suicidal ideas. The patient is nervous/anxious.   All other systems reviewed and are negative.   Per HPI unless specifically indicated above   Allergies as of 06/04/2020   No Known Allergies     Medication List       Accurate as of June 04, 2020  9:19 AM. If you have any questions, ask your nurse or doctor.        acetaminophen 325 MG tablet Commonly known as: TYLENOL Take 650 mg by mouth every 6 (six) hours as needed.   DULoxetine 30 MG capsule Commonly known as: CYMBALTA Take 1 capsule (30 mg total) by mouth daily.   levothyroxine 75 MCG tablet Commonly known as: SYNTHROID TAKE 1 TABLET DAILY   valACYclovir 1000 MG tablet Commonly known as: VALTREX Take 1 tablet (1,000 mg total) by mouth 2 (two) times daily.        Objective:   BP 103/74   Pulse 89   Temp 97.6 F (36.4 C)   Ht 5' 5"  (1.651 m)   Wt 129 lb (58.5 kg)   SpO2 97%   BMI 21.47 kg/m   Wt Readings from Last 3 Encounters:  06/04/20 129 lb (58.5 kg)  12/01/18 130 lb (59 kg)  10/14/18 127 lb 12.8 oz (58 kg)    Physical Exam Vitals and nursing note reviewed.  Constitutional:      General: She  is not in acute distress.    Appearance: She is well-developed. She is not diaphoretic.  Eyes:     Conjunctiva/sclera: Conjunctivae normal.  Cardiovascular:     Rate and Rhythm: Normal rate and regular rhythm.     Heart sounds: Normal heart sounds. No murmur heard.   Pulmonary:     Effort: Pulmonary effort is normal. No respiratory distress.     Breath sounds: Normal breath sounds. No wheezing.  Musculoskeletal:        General: No tenderness. Normal range of motion.  Skin:    General: Skin is warm and dry.     Findings: No rash.  Neurological:     Mental Status: She is alert and oriented to person, place, and time.     Coordination:  Coordination normal.  Psychiatric:        Behavior: Behavior normal.       Assessment & Plan:   Problem List Items Addressed This Visit      Endocrine   Hypothyroidism - Primary   Relevant Medications   levothyroxine (SYNTHROID) 75 MCG tablet   Other Relevant Orders   Thyroid Panel With TSH   CMP14+EGFR     Other   Depression   Relevant Medications   DULoxetine (CYMBALTA) 30 MG capsule   Other Relevant Orders   CMP14+EGFR   HSV-1 (herpes simplex virus 1) infection   Relevant Medications   valACYclovir (VALTREX) 1000 MG tablet   GAD (generalized anxiety disorder)   Relevant Medications   DULoxetine (CYMBALTA) 30 MG capsule    Other Visit Diagnoses    Fibromyalgia       Relevant Medications   DULoxetine (CYMBALTA) 30 MG capsule   Other Relevant Orders   CBC with Differential/Platelet   Need for hepatitis C screening test       Relevant Orders   Hepatitis C antibody   Lipid screening       Relevant Orders   Lipid panel      Continue current medication, will check thyroid levels, follow-up in 6 months,  Left hand rash is healing so is hard to tell what it was but continue moisturizing. Follow up plan: Return in about 6 months (around 12/03/2020), or if symptoms worsen or fail to improve, for Hypothyroidism recheck.  Counseling provided for all of the vaccine components No orders of the defined types were placed in this encounter.   Caryl Pina, MD [facility] 06/04/2020, 9:19 AM

## 2020-06-05 LAB — HEPATITIS C ANTIBODY: Hep C Virus Ab: <0.1 s/co ratio (ref 0.0–0.9)

## 2020-06-05 LAB — LIPID PANEL
Chol/HDL Ratio: 2.4 ratio (ref 0.0–4.4)
Cholesterol, Total: 132 mg/dL (ref 100–199)
HDL: 54 mg/dL (ref >39)
LDL Chol Calc (NIH): 65 mg/dL (ref 0–99)
Triglycerides: 63 mg/dL (ref 0–149)
VLDL Cholesterol Cal: 13 mg/dL (ref 5–40)

## 2020-06-05 LAB — CMP14+EGFR
ALT: 13 IU/L (ref 0–32)
AST: 21 IU/L (ref 0–40)
Albumin/Globulin Ratio: 1.6 (ref 1.2–2.2)
Albumin: 4.3 g/dL (ref 3.8–4.8)
Alkaline Phosphatase: 64 IU/L (ref 48–121)
BUN/Creatinine Ratio: 19 (ref 9–23)
BUN: 12 mg/dL (ref 6–24)
Bilirubin Total: <0.2 mg/dL (ref 0.0–1.2)
CO2: 25 mmol/L (ref 20–29)
Calcium: 9 mg/dL (ref 8.7–10.2)
Chloride: 101 mmol/L (ref 96–106)
Creatinine, Ser: 0.64 mg/dL (ref 0.57–1.00)
GFR calc Af Amer: 122 mL/min/{1.73_m2} (ref >59)
GFR calc non Af Amer: 106 mL/min/{1.73_m2} (ref >59)
Globulin, Total: 2.7 g/dL (ref 1.5–4.5)
Glucose: 86 mg/dL (ref 65–99)
Potassium: 4.9 mmol/L (ref 3.5–5.2)
Sodium: 139 mmol/L (ref 134–144)
Total Protein: 7 g/dL (ref 6.0–8.5)

## 2020-06-05 LAB — CBC WITH DIFFERENTIAL/PLATELET
Basophils Absolute: 0.1 10*3/uL (ref 0.0–0.2)
Basos: 1 %
EOS (ABSOLUTE): 0.5 10*3/uL — ABNORMAL HIGH (ref 0.0–0.4)
Eos: 8 %
Hematocrit: 39.8 % (ref 34.0–46.6)
Hemoglobin: 12.8 g/dL (ref 11.1–15.9)
Immature Grans (Abs): 0 10*3/uL (ref 0.0–0.1)
Immature Granulocytes: 0 %
Lymphocytes Absolute: 1.6 10*3/uL (ref 0.7–3.1)
Lymphs: 25 %
MCH: 29.3 pg (ref 26.6–33.0)
MCHC: 32.2 g/dL (ref 31.5–35.7)
MCV: 91 fL (ref 79–97)
Monocytes Absolute: 0.4 10*3/uL (ref 0.1–0.9)
Monocytes: 6 %
Neutrophils Absolute: 3.8 10*3/uL (ref 1.4–7.0)
Neutrophils: 60 %
Platelets: 350 10*3/uL (ref 150–450)
RBC: 4.37 x10E6/uL (ref 3.77–5.28)
RDW: 12.7 % (ref 11.7–15.4)
WBC: 6.4 10*3/uL (ref 3.4–10.8)

## 2020-06-05 LAB — THYROID PANEL WITH TSH
Free Thyroxine Index: 1.6 (ref 1.2–4.9)
T3 Uptake Ratio: 25 % (ref 24–39)
T4, Total: 6.2 ug/dL (ref 4.5–12.0)
TSH: 2.5 u[IU]/mL (ref 0.450–4.500)

## 2020-06-13 ENCOUNTER — Other Ambulatory Visit: Payer: Self-pay

## 2020-06-13 ENCOUNTER — Emergency Department (HOSPITAL_COMMUNITY)
Admission: EM | Admit: 2020-06-13 | Discharge: 2020-06-13 | Disposition: A | Discharge status: Home / Self Care | Payer: 59 | Attending: Emergency Medicine | Admitting: Emergency Medicine

## 2020-06-13 ENCOUNTER — Encounter (HOSPITAL_COMMUNITY): Payer: Self-pay | Admitting: *Deleted

## 2020-06-13 DIAGNOSIS — R110 Nausea: Secondary | ICD-10-CM | POA: Insufficient documentation

## 2020-06-13 DIAGNOSIS — K0889 Other specified disorders of teeth and supporting structures: Secondary | ICD-10-CM | POA: Diagnosis not present

## 2020-06-13 DIAGNOSIS — H9201 Otalgia, right ear: Secondary | ICD-10-CM | POA: Diagnosis not present

## 2020-06-13 DIAGNOSIS — R519 Headache, unspecified: Secondary | ICD-10-CM | POA: Insufficient documentation

## 2020-06-13 DIAGNOSIS — Z5321 Procedure and treatment not carried out due to patient leaving prior to being seen by health care provider: Secondary | ICD-10-CM | POA: Insufficient documentation

## 2020-06-13 NOTE — ED Triage Notes (Signed)
Pt with dental pain on right side since yesterday.  Pain has radiated up to right ear and head, pt states she can taste blood in her mouth and feels nauseated at present.  Pt has called her dentist and was going to call something in for her but has not received any message concerning it.

## 2020-06-15 ENCOUNTER — Telehealth (INDEPENDENT_AMBULATORY_CARE_PROVIDER_SITE_OTHER): Payer: 59 | Admitting: Nurse Practitioner

## 2020-06-15 DIAGNOSIS — R590 Localized enlarged lymph nodes: Secondary | ICD-10-CM | POA: Diagnosis not present

## 2020-06-15 DIAGNOSIS — K047 Periapical abscess without sinus: Secondary | ICD-10-CM

## 2020-06-15 MED ORDER — CLINDAMYCIN HCL 300 MG PO CAPS: 300.0000 mg | ORAL_CAPSULE | Freq: Three times a day (TID) | ORAL | 0 refills | Status: DC

## 2020-06-15 NOTE — Progress Notes (Signed)
   Virtual Visit via video Note   Due to COVID-19 pandemic this visit was conducted virtually. This visit type was conducted due to national recommendations for restrictions regarding the COVID-19 Pandemic (e.g. social distancing, sheltering in place) in an effort to limit this patient's exposure and mitigate transmission in our community. All issues noted in this document were discussed and addressed.  A physical exam was not performed with this format.  I connected with  Shari Knox  on 06/15/20 at 12:45 by video and verified that I am speaking with the correct person using two identifiers. Shari Knox is currently located at home and no one is currently with  her during visit. The provider, Mary-Margaret Daphine Deutscher, FNP is located in their office at time of visit.  I discussed the limitations, risks, security and privacy concerns of performing an evaluation and management service by video  and the availability of in person appointments. I also discussed with the patient that there may be a patient responsible charge related to this service. The patient expressed understanding and agreed to proceed.   History and Present Illness:   Chief Complaint: tooth aches and swollen lymph nodes  Patient calls in c/o lymph nodes in neck swollen and tender. She spoke to her dentist 2 days ago and he called her in some amoxicillin. Neck has gotten worse. She is not sure if she has a fever or not.    Review of Systems  Constitutional: Negative for chills and fever.  HENT: Positive for sore throat.   Respiratory: Negative for cough.   Neurological: Negative.   Psychiatric/Behavioral: Negative.   All other systems reviewed and are negative.      Observations/Objective: Alert and oriented- answers all questions appropriately No distress voice hoarse Neck pain on right with palpation   Assessment and Plan: Carnell Casamento in today with chief complaint of No chief complaint on file.   1. Tooth  abscess Need to see dentist if does not mprove Motrin 800mg  OTC q6hrs prn - clindamycin (CLEOCIN) 300 MG capsule; Take 1 capsule (300 mg total) by mouth 3 (three) times daily.  Dispense: 30 capsule; Refill: 0  2. Cervical lymphadenopathy Ice to area Call if no better in 72 hours      Follow Up Instructions: prn    I discussed the assessment and treatment plan with the patient. The patient was provided an opportunity to ask questions and all were answered. The patient agreed with the plan and demonstrated an understanding of the instructions.   The patient was advised to call back or seek an in-person evaluation if the symptoms worsen or if the condition fails to improve as anticipated.  The above assessment and management plan was discussed with the patient. The patient verbalized understanding of and has agreed to the management plan. Patient is aware to call the clinic if symptoms persist or worsen. Patient is aware when to return to the clinic for a follow-up visit. Patient educated on when it is appropriate to go to the emergency department.   Time call ended:1:05  I provided 20 minutes of face-to-face time during this encounter.    Mary-Margaret , FNP

## 2020-12-19 ENCOUNTER — Other Ambulatory Visit: Payer: Self-pay | Admitting: Family Medicine

## 2020-12-19 DIAGNOSIS — B009 Herpesviral infection, unspecified: Secondary | ICD-10-CM

## 2021-04-12 ENCOUNTER — Other Ambulatory Visit (HOSPITAL_COMMUNITY): Payer: Self-pay | Admitting: Family Medicine

## 2021-04-12 DIAGNOSIS — Z1231 Encounter for screening mammogram for malignant neoplasm of breast: Secondary | ICD-10-CM

## 2021-04-15 ENCOUNTER — Other Ambulatory Visit: Payer: Self-pay | Admitting: Family Medicine

## 2021-04-15 DIAGNOSIS — B009 Herpesviral infection, unspecified: Secondary | ICD-10-CM

## 2021-05-21 ENCOUNTER — Ambulatory Visit: Payer: 59 | Admitting: Family Medicine

## 2021-05-21 ENCOUNTER — Encounter: Payer: Self-pay | Admitting: Family Medicine

## 2021-05-21 ENCOUNTER — Other Ambulatory Visit: Payer: Self-pay

## 2021-05-21 VITALS — BP 121/78 | HR 74 | Ht 65.0 in | Wt 128.0 lb

## 2021-05-21 DIAGNOSIS — F3341 Major depressive disorder, recurrent, in partial remission: Secondary | ICD-10-CM | POA: Diagnosis not present

## 2021-05-21 DIAGNOSIS — E034 Atrophy of thyroid (acquired): Secondary | ICD-10-CM | POA: Diagnosis not present

## 2021-05-21 DIAGNOSIS — M797 Fibromyalgia: Secondary | ICD-10-CM | POA: Diagnosis not present

## 2021-05-21 DIAGNOSIS — F411 Generalized anxiety disorder: Secondary | ICD-10-CM

## 2021-05-21 DIAGNOSIS — Z13220 Encounter for screening for lipoid disorders: Secondary | ICD-10-CM

## 2021-05-21 DIAGNOSIS — B372 Candidiasis of skin and nail: Secondary | ICD-10-CM

## 2021-05-21 MED ORDER — NYSTATIN-TRIAMCINOLONE 100000-0.1 UNIT/GM-% EX OINT: 1.0000 "application " | TOPICAL_OINTMENT | Freq: Two times a day (BID) | CUTANEOUS | 1 refills | Status: DC

## 2021-05-21 MED ORDER — LEVOTHYROXINE SODIUM 75 MCG PO TABS: 75.0000 ug | ORAL_TABLET | Freq: Every day | ORAL | 3 refills | Status: DC

## 2021-05-21 MED ORDER — DULOXETINE HCL 30 MG PO CPEP: 30.0000 mg | ORAL_CAPSULE | Freq: Every day | ORAL | 3 refills | Status: DC

## 2021-05-21 NOTE — Progress Notes (Signed)
BP 121/78   Pulse 74   Ht 5' 5"  (1.651 m)   Wt 128 lb (58.1 kg)   SpO2 99%   BMI 21.30 kg/m    Subjective:   Patient ID: Shari Knox, female    DOB: Apr 05, 1971, 50 y.o.   MRN: [id number]  HPI: Shari Knox is a 50 y.o. female presenting on 05/21/2021 for Medical Management of Chronic Issues, Hypothyroidism, and Rash (Neck. Started a few weeks ago.)   HPI Hypothyroidism recheck Patient is coming in for thyroid recheck today as well. They deny any issues with hair changes or heat or cold problems or diarrhea or constipation. They deny any chest pain or palpitations. They are currently on levothyroxine 75 micrograms   Depression Patient is coming in for recheck for depression and anxiety.  She says she is doing a little bit worse with her depression and anxiety but has started taking a little bit of Cymbalta sometimes will take an extra capsule couple times a week and that does help.  She does not want to change her dose to increase it consistently but wants to keep going where she is at.  A lot of her anxiety stems from her husband working out of town and having to work 7 days a week and then having some family health issues with her parents.  Rash Patient has developed a rash started on the right side of her neck and has gone around to the left side of her neck.  She says it started with small little blisters but now that she is more red.  She did use some dandruff shampoo on it and it did seem to help a little bit and then she has been using a moisturizing Goldbond cream that does help some as well.  It has not been clearing is been going on for few weeks  Relevant past medical, surgical, family and social history reviewed and updated as indicated. Interim medical history since our last visit reviewed. Allergies and medications reviewed and updated.  Review of Systems  Constitutional:  Negative for chills and fever.  HENT:  Negative for congestion, ear discharge and ear pain.   Eyes:   Negative for redness and visual disturbance.  Respiratory:  Negative for chest tightness and shortness of breath.   Cardiovascular:  Negative for chest pain and leg swelling.  Genitourinary:  Negative for difficulty urinating and dysuria.  Musculoskeletal:  Negative for back pain and gait problem.  Skin:  Positive for rash (Raised pink rash, very pruritic per patient). Negative for color change.  Neurological:  Negative for light-headedness and headaches.  Psychiatric/Behavioral:  Positive for dysphoric mood. Negative for agitation, behavioral problems, self-injury, sleep disturbance and suicidal ideas. The patient is nervous/anxious.   All other systems reviewed and are negative.  Per HPI unless specifically indicated above   Allergies as of 05/21/2021   No Known Allergies      Medication List        Accurate as of May 21, 2021 12:03 PM. If you have any questions, ask your nurse or doctor.          STOP taking these medications    clindamycin 300 MG capsule Commonly known as: Cleocin Stopped by: Fransisca Kaufmann Jerene Yeager, MD       TAKE these medications    acetaminophen 325 MG tablet Commonly known as: TYLENOL Take 650 mg by mouth every 6 (six) hours as needed.   DULoxetine 30 MG capsule Commonly known as: CYMBALTA Take 1  capsule (30 mg total) by mouth daily.   levothyroxine 75 MCG tablet Commonly known as: SYNTHROID Take 1 tablet (75 mcg total) by mouth daily.   nystatin-triamcinolone ointment Commonly known as: MYCOLOG Apply 1 application topically 2 (two) times daily. Started by: Fransisca Kaufmann Baeleigh Devincent, MD   valACYclovir 1000 MG tablet Commonly known as: VALTREX TAKE ONE TABLET BY MOUTH TWICE DAILY         Objective:   BP 121/78   Pulse 74   Ht 5' 5"  (1.651 m)   Wt 128 lb (58.1 kg)   SpO2 99%   BMI 21.30 kg/m   Wt Readings from Last 3 Encounters:  05/21/21 128 lb (58.1 kg)  06/13/20 130 lb (59 kg)  06/04/20 129 lb (58.5 kg)    Physical  Exam Vitals and nursing note reviewed.  Constitutional:      General: She is not in acute distress.    Appearance: She is well-developed. She is not diaphoretic.  Eyes:     Conjunctiva/sclera: Conjunctivae normal.     Pupils: Pupils are equal, round, and reactive to light.  Cardiovascular:     Rate and Rhythm: Normal rate and regular rhythm.     Heart sounds: Normal heart sounds. No murmur heard. Pulmonary:     Effort: Pulmonary effort is normal. No respiratory distress.     Breath sounds: Normal breath sounds. No wheezing.  Musculoskeletal:        General: No tenderness. Normal range of motion.  Skin:    General: Skin is warm and dry.     Findings: Rash (Pink papular rash with satellite lesions on both sides of her neck, with scaliness) present.  Neurological:     Mental Status: She is alert and oriented to person, place, and time.     Coordination: Coordination normal.  Psychiatric:        Behavior: Behavior normal.      Assessment & Plan:   Problem List Items Addressed This Visit       Endocrine   Hypothyroidism   Relevant Medications   levothyroxine (SYNTHROID) 75 MCG tablet   Other Relevant Orders   TSH     Other   Depression - Primary   Relevant Medications   DULoxetine (CYMBALTA) 30 MG capsule   Other Relevant Orders   CBC with Differential/Platelet   CMP14+EGFR   GAD (generalized anxiety disorder)   Relevant Medications   DULoxetine (CYMBALTA) 30 MG capsule   Other Visit Diagnoses     Fibromyalgia       Relevant Medications   DULoxetine (CYMBALTA) 30 MG capsule   Yeast dermatitis       Relevant Medications   nystatin-triamcinolone ointment (MYCOLOG)   Lipid screening       Relevant Orders   Lipid panel       And dermatitis rash, no change in medication for now, will check blood work. Follow up plan: Return in about 6 months (around 11/21/2021), or if symptoms worsen or fail to improve, for Thyroid recheck.  Counseling provided for all of the  vaccine components Orders Placed This Encounter  Procedures   CBC with Differential/Platelet   CMP14+EGFR   Lipid panel   TSH    Caryl Pina, MD [facility] 05/21/2021, 12:03 PM

## 2021-05-22 LAB — CBC WITH DIFFERENTIAL/PLATELET
Basophils Absolute: 0.1 10*3/uL (ref 0.0–0.2)
Basos: 1 %
EOS (ABSOLUTE): 0.5 10*3/uL — ABNORMAL HIGH (ref 0.0–0.4)
Eos: 6 %
Hematocrit: 39 % (ref 34.0–46.6)
Hemoglobin: 12.8 g/dL (ref 11.1–15.9)
Immature Grans (Abs): 0 10*3/uL (ref 0.0–0.1)
Immature Granulocytes: 0 %
Lymphocytes Absolute: 2.2 10*3/uL (ref 0.7–3.1)
Lymphs: 26 %
MCH: 29.6 pg (ref 26.6–33.0)
MCHC: 32.8 g/dL (ref 31.5–35.7)
MCV: 90 fL (ref 79–97)
Monocytes Absolute: 0.5 10*3/uL (ref 0.1–0.9)
Monocytes: 6 %
Neutrophils Absolute: 5.1 10*3/uL (ref 1.4–7.0)
Neutrophils: 61 %
Platelets: 333 10*3/uL (ref 150–450)
RBC: 4.32 x10E6/uL (ref 3.77–5.28)
RDW: 12.4 % (ref 11.7–15.4)
WBC: 8.4 10*3/uL (ref 3.4–10.8)

## 2021-05-22 LAB — CMP14+EGFR
ALT: 17 IU/L (ref 0–32)
AST: 22 IU/L (ref 0–40)
Albumin/Globulin Ratio: 1.7 (ref 1.2–2.2)
Albumin: 4.5 g/dL (ref 3.8–4.8)
Alkaline Phosphatase: 53 IU/L (ref 44–121)
BUN/Creatinine Ratio: 8 — ABNORMAL LOW (ref 9–23)
BUN: 5 mg/dL — ABNORMAL LOW (ref 6–24)
Bilirubin Total: 0.3 mg/dL (ref 0.0–1.2)
CO2: 27 mmol/L (ref 20–29)
Calcium: 9.3 mg/dL (ref 8.7–10.2)
Chloride: 98 mmol/L (ref 96–106)
Creatinine, Ser: 0.59 mg/dL (ref 0.57–1.00)
Globulin, Total: 2.6 g/dL (ref 1.5–4.5)
Glucose: 85 mg/dL (ref 65–99)
Potassium: 3.9 mmol/L (ref 3.5–5.2)
Sodium: 140 mmol/L (ref 134–144)
Total Protein: 7.1 g/dL (ref 6.0–8.5)
eGFR: 110 mL/min/{1.73_m2} (ref >59)

## 2021-05-22 LAB — LIPID PANEL
Chol/HDL Ratio: 2.7 ratio (ref 0.0–4.4)
Cholesterol, Total: 159 mg/dL (ref 100–199)
HDL: 60 mg/dL (ref >39)
LDL Chol Calc (NIH): 87 mg/dL (ref 0–99)
Triglycerides: 57 mg/dL (ref 0–149)
VLDL Cholesterol Cal: 12 mg/dL (ref 5–40)

## 2021-05-22 LAB — TSH: TSH: 2.88 u[IU]/mL (ref 0.450–4.500)

## 2021-05-29 ENCOUNTER — Other Ambulatory Visit: Payer: Self-pay

## 2021-05-29 ENCOUNTER — Ambulatory Visit (HOSPITAL_COMMUNITY)
Admission: RE | Admit: 2021-05-29 | Discharge: 2021-05-29 | Disposition: A | Discharge status: Home / Self Care | Payer: 59 | Source: Ambulatory Visit | Attending: Family Medicine | Admitting: Family Medicine

## 2021-05-29 DIAGNOSIS — Z1231 Encounter for screening mammogram for malignant neoplasm of breast: Secondary | ICD-10-CM | POA: Diagnosis not present

## 2021-06-03 ENCOUNTER — Other Ambulatory Visit (HOSPITAL_COMMUNITY): Payer: Self-pay | Admitting: Family Medicine

## 2021-06-03 DIAGNOSIS — R928 Other abnormal and inconclusive findings on diagnostic imaging of breast: Secondary | ICD-10-CM

## 2021-06-04 ENCOUNTER — Other Ambulatory Visit: Payer: Self-pay

## 2021-06-04 ENCOUNTER — Ambulatory Visit (HOSPITAL_COMMUNITY)
Admission: RE | Admit: 2021-06-04 | Discharge: 2021-06-04 | Disposition: A | Discharge status: Home / Self Care | Payer: 59 | Source: Ambulatory Visit | Attending: Family Medicine | Admitting: Family Medicine

## 2021-06-04 DIAGNOSIS — R928 Other abnormal and inconclusive findings on diagnostic imaging of breast: Secondary | ICD-10-CM | POA: Diagnosis present

## 2021-07-09 ENCOUNTER — Encounter (HOSPITAL_COMMUNITY): Payer: 59

## 2021-07-09 ENCOUNTER — Other Ambulatory Visit (HOSPITAL_COMMUNITY): Payer: 59

## 2021-11-07 ENCOUNTER — Other Ambulatory Visit: Payer: Self-pay | Admitting: Family Medicine

## 2021-11-07 DIAGNOSIS — B009 Herpesviral infection, unspecified: Secondary | ICD-10-CM

## 2022-04-17 ENCOUNTER — Ambulatory Visit: Payer: BC Managed Care – PPO | Admitting: Family Medicine

## 2022-04-17 ENCOUNTER — Encounter: Payer: Self-pay | Admitting: Family Medicine

## 2022-04-17 VITALS — BP 106/77 | HR 99 | Temp 97.9°F | Ht 65.0 in | Wt 124.0 lb

## 2022-04-17 DIAGNOSIS — F3341 Major depressive disorder, recurrent, in partial remission: Secondary | ICD-10-CM

## 2022-04-17 DIAGNOSIS — M797 Fibromyalgia: Secondary | ICD-10-CM

## 2022-04-17 DIAGNOSIS — E034 Atrophy of thyroid (acquired): Secondary | ICD-10-CM

## 2022-04-17 DIAGNOSIS — B372 Candidiasis of skin and nail: Secondary | ICD-10-CM

## 2022-04-17 DIAGNOSIS — F411 Generalized anxiety disorder: Secondary | ICD-10-CM

## 2022-04-17 DIAGNOSIS — B009 Herpesviral infection, unspecified: Secondary | ICD-10-CM

## 2022-04-17 MED ORDER — NYSTATIN-TRIAMCINOLONE 100000-0.1 UNIT/GM-% EX OINT: 1.0000 | TOPICAL_OINTMENT | Freq: Two times a day (BID) | CUTANEOUS | 1 refills | Status: DC

## 2022-04-17 MED ORDER — VALACYCLOVIR HCL 1 G PO TABS: 1000.0000 mg | ORAL_TABLET | Freq: Two times a day (BID) | ORAL | 2 refills | Status: DC

## 2022-04-17 MED ORDER — DULOXETINE HCL 30 MG PO CPEP: 30.0000 mg | ORAL_CAPSULE | Freq: Every day | ORAL | 3 refills | Status: DC

## 2022-04-17 MED ORDER — LEVOTHYROXINE SODIUM 75 MCG PO TABS: 75.0000 ug | ORAL_TABLET | Freq: Every day | ORAL | 3 refills | Status: DC

## 2022-04-17 NOTE — [facility] (Signed)
BP 106/77   Pulse 99   Temp 97.9 F (36.6 C)   Ht 5' 5"  (1.651 m)   Wt 124 lb (56.2 kg)   SpO2 97%   BMI 20.63 kg/m    Subjective:   Patient ID: Shari Knox, female    DOB: Oct 25, 1970, 51 y.o.   MRN: [id number]  HPI: Shari Knox is a 51 y.o. 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  Denies any suicidal ideations or thoughts of hurting herself    04/17/2022    9:20 AM 05/21/2021   11:55 AM 05/21/2021   11:17 AM 06/04/2020    8:57 AM 12/01/2018    3:54 PM  Depression screen PHQ 2/9  Decreased Interest 0  0 0 1  Down, Depressed, Hopeless 0  0 0 1  PHQ - 2 Score 0  0 0 2  Altered sleeping 0 1   1  Tired, decreased energy 1 1   1   Change in appetite 0 0   1  Feeling bad or failure about yourself  0    0  Trouble concentrating 0    0  Moving slowly or fidgety/restless 0    0  Suicidal thoughts 0    0  PHQ-9 Score 1    5  Difficult doing work/chores Not difficult at all        Hypothyroidism recheck Patient is coming in for thyroid recheck today as well. They deny any issues with hair changes or heat or cold problems or diarrhea or constipation. They deny any chest pain or palpitations. They are currently on levothyroxine 75 micrograms   Patient takes Valtrex occasionally for cold sores, wants a refill for when she gets a flareup.  She has not had cold sores this year but likes to keep the Valtrex on hand just in case  She occasionally feels a pulsating sensation in her right ear but denies any  drainage or pain.  She says is not all the time but it comes and goes.  Relevant past medical, surgical, family and social history reviewed and updated as indicated. Interim medical history since our last visit reviewed. Allergies and medications reviewed and updated.  Review of Systems  Constitutional:  Negative for chills and fever.  HENT:  Negative for congestion, ear discharge and ear pain.   Eyes:  Negative for redness and visual disturbance.  Respiratory:  Negative for chest tightness and shortness of breath.   Cardiovascular:  Negative for chest pain and leg swelling.  Genitourinary:  Negative for difficulty urinating and dysuria.  Musculoskeletal:  Negative for back pain and gait problem.  Skin:  Negative for rash.  Neurological:  Negative for dizziness, light-headedness and headaches.  Psychiatric/Behavioral:  Negative for agitation, behavioral problems, dysphoric mood, self-injury, sleep disturbance and suicidal ideas. The patient is nervous/anxious.   All other systems reviewed and are negative.   Per HPI unless specifically indicated above   Allergies as of 04/17/2022  No Known Allergies      Medication List        Accurate as of April 17, 2022  9:50 AM. If you have any questions, ask your nurse or doctor.          acetaminophen 325 MG tablet Commonly known as: TYLENOL Take 650 mg by mouth every 6 (six) hours as needed.   DULoxetine 30 MG capsule Commonly known as: CYMBALTA Take 1 capsule (30 mg total) by mouth daily.   levothyroxine 75 MCG tablet Commonly known as: SYNTHROID Take 1 tablet (75 mcg total) by mouth daily.   nystatin-triamcinolone ointment Commonly known as: MYCOLOG Apply 1 Application topically 2 (two) times daily.   valACYclovir 1000 MG tablet Commonly known as: VALTREX Take 1 tablet (1,000 mg total) by mouth 2 (two) times daily.         Objective:   BP 106/77   Pulse 99   Temp 97.9 F (36.6 C)   Ht 5' 5"  (1.651 m)   Wt  124 lb (56.2 kg)   SpO2 97%   BMI 20.63 kg/m   Wt Readings from Last 3 Encounters:  04/17/22 124 lb (56.2 kg)  05/21/21 128 lb (58.1 kg)  06/13/20 130 lb (59 kg)    Physical Exam Vitals and nursing note reviewed.  Constitutional:      General: She is not in acute distress.    Appearance: She is well-developed. She is not diaphoretic.  Eyes:     Conjunctiva/sclera: Conjunctivae normal.  Cardiovascular:     Rate and Rhythm: Normal rate and regular rhythm.     Heart sounds: Normal heart sounds. No murmur heard. Pulmonary:     Effort: Pulmonary effort is normal. No respiratory distress.     Breath sounds: Normal breath sounds. No wheezing.  Musculoskeletal:        General: No swelling or tenderness. Normal range of motion.  Skin:    General: Skin is warm and dry.     Findings: No rash.  Neurological:     Mental Status: She is alert and oriented to person, place, and time.     Coordination: Coordination normal.  Psychiatric:        Behavior: Behavior normal.       Assessment & Plan:   Problem List Items Addressed This Visit       Endocrine   Hypothyroidism - Primary   Relevant Medications   levothyroxine (SYNTHROID) 75 MCG tablet   Other Relevant Orders   CBC with Differential/Platelet   CMP14+EGFR   Lipid panel   TSH     Other   Depression   Relevant Medications   DULoxetine (CYMBALTA) 30 MG capsule   GAD (generalized anxiety disorder)   Relevant Medications   DULoxetine (CYMBALTA) 30 MG capsule   HSV-1 (herpes simplex virus 1) infection   Relevant Medications   nystatin-triamcinolone ointment (MYCOLOG)   valACYclovir (VALTREX) 1000 MG tablet   Other Visit Diagnoses     Fibromyalgia       Relevant Medications   DULoxetine (CYMBALTA) 30 MG capsule   Yeast dermatitis       Relevant Medications   nystatin-triamcinolone ointment (MYCOLOG)   valACYclovir (VALTREX) 1000 MG tablet     Continue current medicine, will check blood work.  Follow up  plan: Return in about 6 months (around 10/18/2022), or if symptoms worsen or fail to improve, for Thyroid and depression and anxiety recheck.  Counseling provided for all of the vaccine components Orders Placed This  Encounter  Procedures   CBC with Differential/Platelet   CMP14+EGFR   Lipid panel   TSH    Caryl Pina, MD [facility] 04/17/2022, 9:50 AM

## 2022-04-18 LAB — CMP14+EGFR
ALT: 17 IU/L (ref 0–32)
AST: 21 IU/L (ref 0–40)
Albumin/Globulin Ratio: 1.6 (ref 1.2–2.2)
Albumin: 4.4 g/dL (ref 3.9–4.9)
Alkaline Phosphatase: 62 IU/L (ref 44–121)
BUN/Creatinine Ratio: 18 (ref 9–23)
BUN: 11 mg/dL (ref 6–24)
Bilirubin Total: 0.5 mg/dL (ref 0.0–1.2)
CO2: 28 mmol/L (ref 20–29)
Calcium: 9.3 mg/dL (ref 8.7–10.2)
Chloride: 99 mmol/L (ref 96–106)
Creatinine, Ser: 0.62 mg/dL (ref 0.57–1.00)
Globulin, Total: 2.7 g/dL (ref 1.5–4.5)
Glucose: 96 mg/dL (ref 70–99)
Potassium: 4.6 mmol/L (ref 3.5–5.2)
Sodium: 138 mmol/L (ref 134–144)
Total Protein: 7.1 g/dL (ref 6.0–8.5)
eGFR: 108 mL/min/{1.73_m2} (ref >59)

## 2022-04-18 LAB — CBC WITH DIFFERENTIAL/PLATELET
Basophils Absolute: 0.1 10*3/uL (ref 0.0–0.2)
Basos: 1 %
EOS (ABSOLUTE): 0.3 10*3/uL (ref 0.0–0.4)
Eos: 6 %
Hematocrit: 41.1 % (ref 34.0–46.6)
Hemoglobin: 13.2 g/dL (ref 11.1–15.9)
Immature Grans (Abs): 0 10*3/uL (ref 0.0–0.1)
Immature Granulocytes: 0 %
Lymphocytes Absolute: 1.3 10*3/uL (ref 0.7–3.1)
Lymphs: 23 %
MCH: 29.1 pg (ref 26.6–33.0)
MCHC: 32.1 g/dL (ref 31.5–35.7)
MCV: 91 fL (ref 79–97)
Monocytes Absolute: 0.5 10*3/uL (ref 0.1–0.9)
Monocytes: 8 %
Neutrophils Absolute: 3.6 10*3/uL (ref 1.4–7.0)
Neutrophils: 62 %
Platelets: 339 10*3/uL (ref 150–450)
RBC: 4.54 x10E6/uL (ref 3.77–5.28)
RDW: 12.3 % (ref 11.7–15.4)
WBC: 5.8 10*3/uL (ref 3.4–10.8)

## 2022-04-18 LAB — TSH: TSH: 1.79 u[IU]/mL (ref 0.450–4.500)

## 2022-04-18 LAB — LIPID PANEL
Chol/HDL Ratio: 2.7 ratio (ref 0.0–4.4)
Cholesterol, Total: 173 mg/dL (ref 100–199)
HDL: 64 mg/dL (ref >39)
LDL Chol Calc (NIH): 97 mg/dL (ref 0–99)
Triglycerides: 63 mg/dL (ref 0–149)
VLDL Cholesterol Cal: 12 mg/dL (ref 5–40)

## 2022-04-30 ENCOUNTER — Other Ambulatory Visit (HOSPITAL_COMMUNITY): Payer: Self-pay | Admitting: Family Medicine

## 2022-04-30 DIAGNOSIS — Z1231 Encounter for screening mammogram for malignant neoplasm of breast: Secondary | ICD-10-CM

## 2022-06-02 ENCOUNTER — Ambulatory Visit (HOSPITAL_COMMUNITY): Payer: 59

## 2022-06-23 ENCOUNTER — Ambulatory Visit (HOSPITAL_COMMUNITY)
Admission: RE | Admit: 2022-06-23 | Discharge: 2022-06-23 | Disposition: A | Discharge status: Home / Self Care | Payer: BC Managed Care – PPO | Source: Ambulatory Visit | Attending: Family Medicine | Admitting: Family Medicine

## 2022-06-23 DIAGNOSIS — Z1231 Encounter for screening mammogram for malignant neoplasm of breast: Secondary | ICD-10-CM | POA: Insufficient documentation

## 2022-08-12 ENCOUNTER — Encounter: Payer: Self-pay | Admitting: Nurse Practitioner

## 2022-08-12 ENCOUNTER — Telehealth (INDEPENDENT_AMBULATORY_CARE_PROVIDER_SITE_OTHER): Payer: BC Managed Care – PPO | Admitting: Nurse Practitioner

## 2022-08-12 DIAGNOSIS — U071 COVID-19: Secondary | ICD-10-CM

## 2022-08-12 MED ORDER — NAPROXEN 500 MG PO TABS: 500.0000 mg | ORAL_TABLET | Freq: Two times a day (BID) | ORAL | 0 refills | Status: AC

## 2022-08-12 MED ORDER — PROMETHAZINE-DM 6.25-15 MG/5ML PO SYRP: 5.0000 mL | ORAL_SOLUTION | Freq: Four times a day (QID) | ORAL | 0 refills | Status: DC | PRN

## 2022-08-12 NOTE — [facility] Notes (Signed)
Virtual Visit Consent   Shari Knox, you are scheduled for a virtual visit with Mary-Margaret Daphine Deutscher, FNP, a [facility] provider, today.     Just as with appointments in the office, your consent must be obtained to participate.  Your consent will be active for this visit and any virtual visit you may have with one of our providers in the next 365 days.     If you have a MyChart account, a copy of this consent can be sent to you electronically.  All virtual visits are billed to your insurance company just like a traditional visit in the office.    As this is a virtual visit, video technology does not allow for your provider to perform a traditional examination.  This may limit your provider's ability to fully assess your condition.  If your provider identifies any concerns that need to be evaluated in person or the need to arrange testing (such as labs, EKG, etc.), we will make arrangements to do so.     Although advances in technology are sophisticated, we cannot ensure that it will always work on either your end or our end.  If the connection with a video visit is poor, the visit may have to be switched to a telephone visit.  With either a video or telephone visit, we are not always able to ensure that we have a secure connection.     I need to obtain your verbal consent now.   Are you willing to proceed with your visit today? YES   Shari Knox has provided verbal consent on 08/12/2022 for a virtual visit (video or telephone).   Mary-Margaret Daphine Deutscher, FNP   Date: 08/12/2022 8:06 AM   Virtual Visit via Video Note   I, Mary-Margaret Daphine Deutscher, connected with Shari Knox ([id number], 23-Jan-1971) on 08/12/22 at 12:05 PM EST by a video-enabled telemedicine application and verified that I am speaking with the correct person using two identifiers.  Location: Patient: Virtual Visit Location Patient: Home Provider: Virtual Visit Location Provider: Mobile   I discussed the limitations of evaluation  and management by telemedicine and the availability of in person appointments. The patient expressed understanding and agreed to proceed.    History of Present Illness: Shari Knox is a 51 y.o. who identifies as a female who was assigned female at birth, and is being seen today for uri.  HPI: URI  This is a new problem. The current episode started in the past 7 days. The problem has been gradually worsening. Associated symptoms include congestion, coughing, headaches and a sore throat. Treatments tried: dayquil and nitequil. The treatment provided mild relief.   Coivd positive last night Review of Systems  HENT:  Positive for congestion and sore throat.   Respiratory:  Positive for cough.   Neurological:  Positive for headaches.    Problems:  Patient Active Problem List   Diagnosis Date Noted   GAD (generalized anxiety disorder) 01/18/2015   Hypothyroidism 06/21/2014   Depression 02/24/2013   HSV-1 (herpes simplex virus 1) infection 02/24/2013    Allergies: No Known Allergies Medications:  Current Outpatient Medications:    acetaminophen (TYLENOL) 325 MG tablet, Take 650 mg by mouth every 6 (six) hours as needed., Disp: , Rfl:    DULoxetine (CYMBALTA) 30 MG capsule, Take 1 capsule (30 mg total) by mouth daily., Disp: 90 capsule, Rfl: 3   levothyroxine (SYNTHROID) 75 MCG tablet, Take 1 tablet (75 mcg total) by mouth daily., Disp: 90 tablet, Rfl: 3  nystatin-triamcinolone ointment (MYCOLOG), Apply 1 Application topically 2 (two) times daily., Disp: 30 g, Rfl: 1   valACYclovir (VALTREX) 1000 MG tablet, Take 1 tablet (1,000 mg total) by mouth 2 (two) times daily., Disp: 12 tablet, Rfl: 2  Observations/Objective: Patient is well-developed, well-nourished in no acute distress.  Resting comfortably  at home.  Head is normocephalic, atraumatic.  No labored breathing.  Speech is clear and coherent with logical content.  Patient is alert and oriented at baseline.    Assessment and  Plan:  Shari Knox in today with chief complaint of URI   1. Positive self-administered antigen test for COVID-19 1. Take meds as prescribed 2. Use a cool mist humidifier especially during the winter months and when heat has been humid. 3. Use saline nose sprays frequently 4. Saline irrigations of the nose can be very helpful if done frequently.  * 4X daily for 1 week*  * Use of a nettie pot can be helpful with this. Follow directions with this* 5. Drink plenty of fluids 6. Keep thermostat turn down low 7.For any cough or congestion- promethazine DM 8. For fever or aces or pains- take tylenol or ibuprofen appropriate for age and weight.  * for fevers greater than 101 orally you may alternate ibuprofen and tylenol every  3 hours.    Meds ordered this encounter  Medications   promethazine-dextromethorphan (PROMETHAZINE-DM) 6.25-15 MG/5ML syrup    Sig: Take 5 mLs by mouth 4 (four) times daily as needed for cough.    Dispense:  236 mL    Refill:  0    Order Specific Question:   Supervising Provider    Answer:   Caryl Pina A [[id number]]   naproxen (NAPROSYN) 500 MG tablet    Sig: Take 1 tablet (500 mg total) by mouth 2 (two) times daily with a meal.    Dispense:  30 tablet    Refill:  0    Order Specific Question:   Supervising Provider    Answer:   Caryl Pina A [[id number]]      Follow Up Instructions: I discussed the assessment and treatment plan with the patient. The patient was provided an opportunity to ask questions and all were answered. The patient agreed with the plan and demonstrated an understanding of the instructions.  A copy of instructions were sent to the patient via MyChart.  The patient was advised to call back or seek an in-person evaluation if the symptoms worsen or if the condition fails to improve as anticipated.  Time:  I spent 6 minutes with the patient via telehealth technology discussing the above problems/concerns.    Mary-Margaret Hassell Done,  FNP

## 2022-08-12 NOTE — Patient Instructions (Signed)
Quarantine and Isolation Quarantine and isolation refer to local and travel restrictions to protect the public and travelers from contagious diseases that constitute a public health threat. Contagious diseases are diseases that can spread from one person to another. Quarantine and isolation help to protect the public by preventing exposure to people who have or may have a contagious disease. Isolation separates people who are sick with a contagious disease from people who are not sick. Quarantine separates and restricts the movement of people who were exposed to a contagious disease to see if they become sick. You may be put in quarantine or isolation if you have been exposed to or diagnosed with any of the following diseases: Severe acute respiratory syndromes, such as COVID-19. Cholera. Diphtheria. Tuberculosis. Plague. Smallpox. Yellow fever. Viral hemorrhagic fevers, such as [place], Ebola, and [place]. When to quarantine or isolate Follow these rules, whether you have been vaccinated or not: Stay home and isolate from others when you are sick with a contagious disease. Isolate when you test positive for a contagious disease, even if you do not have symptoms. Isolate if you are sick and suspect that you may have a contagious disease. If you suspect that you have a contagious disease, get tested. If your test results are negative, you can end your isolation. If your test results are positive, follow the full isolation recommendations as told by your health care provider or local health authorities. Quarantine and stay away from others when you have been in close contact with someone who has tested positive for a contagious disease. Close contact is defined as being less than 6 ft (1.8 m) away from an infected person for a total of 15 minutes or more over a 24-hour period. Do not go to places where you are unable to wear a mask, such as restaurants and some gyms. Stay home and separate  from others as much as possible. Avoid being around people who may get very sick from the contagious disease that you have. Use a separate bathroom, if possible. Do not travel. For travel guidance, visit the CDC's travel webpage at wwwnc.cdc.gov/travel/ Follow these instructions at home: Medicines  Take over-the-counter and prescription medicines as told by your health care provider. Finish all antibiotic medicine even when you start to feel better. Stay up to date with all your vaccines. Get scheduled vaccines and boosters as recommended by your health care provider. Lifestyle Wear a high-quality mask if you must be around others at home and in public, if recommended. Improve air flow (ventilation) at home to help prevent the disease from spreading to other people, if possible. Do not share personal household items, like cups, towels, and utensils. Practice everyday hygiene and cleaning. General instructions Talk to your health care provider if you have a weakened body defense system (immune system). People with a weakened immune system may have a reduced immune response to vaccines. You may need to follow current prevention measures, including wearing a well-fitting mask, avoiding crowds, and avoiding poorly ventilated indoor places. Monitor symptoms and follow health care provider instructions, which may include resting, drinking fluids, and taking medicines. Follow specific isolation and quarantine recommendations if you are in places that can lead to disease outbreaks, such as correctional and detention facilities, homeless shelters, and cruise ships. Return to your normal activities as told by your health care provider. Ask your health care provider what activities are safe for you. Keep all follow-up visits. This is important. Where to find more information CDC: www.cdc.gov/quarantine/index.html Contact   a health care provider if: You have a fever. You have signs and symptoms that  return or get worse after isolation. Get help right away if: You have difficulty breathing. You have chest pain. These symptoms may be an emergency. Get help right away. Call 911. Do not wait to see if the symptoms will go away. Do not drive yourself to the hospital. Summary Isolation and quarantine help protect the public by preventing exposure to people who have or may have a contagious disease. Isolate when you are sick or when you test positive, even if you do not have symptoms. Quarantine and stay away from others when you have been in close contact with someone who has tested positive for a contagious disease. This information is not intended to replace advice given to you by your health care provider. Make sure you discuss any questions you have with your health care provider. Document Revised: 10/03/2021 Document Reviewed: 09/12/2021 Elsevier Patient Education  2023 Elsevier Inc.  

## 2022-10-20 ENCOUNTER — Encounter: Payer: Self-pay | Admitting: Family Medicine

## 2022-10-20 ENCOUNTER — Ambulatory Visit: Payer: BC Managed Care – PPO | Admitting: Family Medicine

## 2022-10-20 VITALS — BP 119/82 | HR 90 | Temp 98.4°F | Ht 65.0 in | Wt 131.0 lb

## 2022-10-20 DIAGNOSIS — E034 Atrophy of thyroid (acquired): Secondary | ICD-10-CM

## 2022-10-20 DIAGNOSIS — F3341 Major depressive disorder, recurrent, in partial remission: Secondary | ICD-10-CM

## 2022-10-20 DIAGNOSIS — F411 Generalized anxiety disorder: Secondary | ICD-10-CM

## 2022-10-20 DIAGNOSIS — M797 Fibromyalgia: Secondary | ICD-10-CM | POA: Diagnosis not present

## 2022-10-20 MED ORDER — DULOXETINE HCL 60 MG PO CPEP: 60.0000 mg | ORAL_CAPSULE | Freq: Every day | ORAL | 3 refills | Status: DC

## 2022-10-20 NOTE — [facility] Notes (Signed)
BP 119/82   Pulse 90   Temp 98.4 F (36.9 C)   Ht 5\' 5"  (1.651 m)   Wt 131 lb (59.4 kg)   SpO2 97%   BMI 21.80 kg/m    Subjective:   Patient ID: Shari Knox, female    DOB: 09-04-1971, 52 y.o.   MRN: [id number]  HPI: Shari Knox is a 52 y.o. female presenting on 10/20/2022 for Medical Management of Chronic Issues   HPI Depression and anxiety recheck Patient is coming in for depression and anxiety recheck.  She is increased after asking the Cymbalta is taking 2 a day so she is taking a total of 60 mg.  She feels like that is doing better for her.  She has not had a lot of stress and a couple losses in her family and some changes at work that have caused that stress.  She does feel like she is doing better on 60 mg of duloxetine.  Has any suicidal ideations or thoughts of hurting herself.    10/20/2022    8:02 AM 04/17/2022    9:20 AM 05/21/2021   11:55 AM 05/21/2021   11:17 AM 06/04/2020    8:57 AM  Depression screen PHQ 2/9  Decreased Interest 0 0  0 0  Down, Depressed, Hopeless 0 0  0 0  PHQ - 2 Score 0 0  0 0  Altered sleeping 1 0 1    Tired, decreased energy 1 1 1     Change in appetite 0 0 0    Feeling bad or failure about yourself  0 0     Trouble concentrating 0 0     Moving slowly or fidgety/restless 0 0     Suicidal thoughts 0 0     PHQ-9 Score 2 1     Difficult doing work/chores Somewhat difficult Not difficult at all       Hypothyroidism recheck Patient is coming in for thyroid recheck today as well. They deny any issues with hair changes or heat or cold problems or diarrhea or constipation. They deny any chest pain or palpitations. They are currently on levothyroxine 75 micrograms   Relevant past medical, surgical, family and social history reviewed and updated as indicated. Interim medical history since our last visit reviewed. Allergies and medications reviewed and updated.  Review of Systems  Constitutional:  Negative for chills and fever.  Eyes:  Negative for  visual disturbance.  Respiratory:  Negative for chest tightness and shortness of breath.   Cardiovascular:  Negative for chest pain and leg swelling.  Musculoskeletal:  Negative for back pain and gait problem.  Skin:  Negative for rash.  Neurological:  Negative for dizziness, light-headedness and headaches.  Psychiatric/Behavioral:  Positive for dysphoric mood and sleep disturbance. Negative for agitation, behavioral problems, self-injury and suicidal ideas. The patient is nervous/anxious.   All other systems reviewed and are negative.   Per HPI unless specifically indicated above   Allergies as of 10/20/2022   No Known Allergies      Medication List        Accurate as of October 20, 2022  8:12 AM. If you have any questions, ask your nurse or doctor.          acetaminophen 325 MG tablet Commonly known as: TYLENOL Take 650 mg by mouth every 6 (six) hours as needed.   DULoxetine 60 MG capsule Commonly known as: CYMBALTA Take 1 capsule (60 mg total) by mouth daily. What changed:  medication  strength how much to take Changed by: Worthy Rancher, MD   levothyroxine 75 MCG tablet Commonly known as: SYNTHROID Take 1 tablet (75 mcg total) by mouth daily.   naproxen 500 MG tablet Commonly known as: Naprosyn Take 1 tablet (500 mg total) by mouth 2 (two) times daily with a meal.   nystatin-triamcinolone ointment Commonly known as: MYCOLOG Apply 1 Application topically 2 (two) times daily.   promethazine-dextromethorphan 6.25-15 MG/5ML syrup Commonly known as: PROMETHAZINE-DM Take 5 mLs by mouth 4 (four) times daily as needed for cough.   valACYclovir 1000 MG tablet Commonly known as: VALTREX Take 1 tablet (1,000 mg total) by mouth 2 (two) times daily.         Objective:   BP 119/82   Pulse 90   Temp 98.4 F (36.9 C)   Ht 5\' 5"  (1.651 m)   Wt 131 lb (59.4 kg)   SpO2 97%   BMI 21.80 kg/m   Wt Readings from Last 3 Encounters:  10/20/22 131 lb (59.4 kg)   04/17/22 124 lb (56.2 kg)  05/21/21 128 lb (58.1 kg)    Physical Exam Vitals and nursing note reviewed.  Constitutional:      General: She is not in acute distress.    Appearance: She is well-developed. She is not diaphoretic.  Eyes:     Conjunctiva/sclera: Conjunctivae normal.  Cardiovascular:     Rate and Rhythm: Normal rate and regular rhythm.     Heart sounds: Normal heart sounds. No murmur heard. Pulmonary:     Effort: Pulmonary effort is normal. No respiratory distress.     Breath sounds: Normal breath sounds. No wheezing.  Musculoskeletal:        General: No swelling or tenderness. Normal range of motion.  Skin:    General: Skin is warm and dry.     Findings: No rash.  Neurological:     Mental Status: She is alert and oriented to person, place, and time.     Coordination: Coordination normal.  Psychiatric:        Behavior: Behavior normal.       Assessment & Plan:   Problem List Items Addressed This Visit       Endocrine   Hypothyroidism - Primary   Relevant Orders   TSH     Other   Depression   Relevant Medications   DULoxetine (CYMBALTA) 60 MG capsule   Other Relevant Orders   CMP14+EGFR   GAD (generalized anxiety disorder)   Relevant Medications   DULoxetine (CYMBALTA) 60 MG capsule   Other Relevant Orders   CMP14+EGFR   Other Visit Diagnoses     Fibromyalgia       Relevant Medications   DULoxetine (CYMBALTA) 60 MG capsule   Other Relevant Orders   CMP14+EGFR     Continue to 60 mg duloxetine, will change the prescription for close just 1 tablet instead of 2 like she has been taking.  She will follow-up on her Pap smear and colonoscopy and tetanus in the future.  Blood pressure everything else looks good, will check blood work.  Follow up plan: Return in about 6 months (around 04/20/2023), or if symptoms worsen or fail to improve, for Physical exam and thyroid.  Counseling provided for all of the vaccine components Orders Placed This  Encounter  Procedures   CMP14+EGFR   TSH    Caryl Pina, MD [facility] 10/20/2022, 8:12 AM

## 2022-10-21 LAB — CMP14+EGFR
ALT: 18 IU/L (ref 0–32)
AST: 23 IU/L (ref 0–40)
Albumin/Globulin Ratio: 1.7 (ref 1.2–2.2)
Albumin: 4.5 g/dL (ref 3.8–4.9)
Alkaline Phosphatase: 61 IU/L (ref 44–121)
BUN/Creatinine Ratio: 19 (ref 9–23)
BUN: 12 mg/dL (ref 6–24)
Bilirubin Total: <0.2 mg/dL (ref 0.0–1.2)
CO2: 25 mmol/L (ref 20–29)
Calcium: 9.3 mg/dL (ref 8.7–10.2)
Chloride: 99 mmol/L (ref 96–106)
Creatinine, Ser: 0.63 mg/dL (ref 0.57–1.00)
Globulin, Total: 2.6 g/dL (ref 1.5–4.5)
Glucose: 88 mg/dL (ref 70–99)
Potassium: 4.2 mmol/L (ref 3.5–5.2)
Sodium: 139 mmol/L (ref 134–144)
Total Protein: 7.1 g/dL (ref 6.0–8.5)
eGFR: 107 mL/min/{1.73_m2} (ref >59)

## 2022-10-21 LAB — TSH: TSH: 3.71 u[IU]/mL (ref 0.450–4.500)

## 2023-05-25 ENCOUNTER — Other Ambulatory Visit (HOSPITAL_COMMUNITY): Payer: Self-pay | Admitting: Family Medicine

## 2023-05-25 DIAGNOSIS — Z1231 Encounter for screening mammogram for malignant neoplasm of breast: Secondary | ICD-10-CM

## 2023-06-06 ENCOUNTER — Other Ambulatory Visit: Payer: Self-pay | Admitting: Family Medicine

## 2023-06-06 DIAGNOSIS — B009 Herpesviral infection, unspecified: Secondary | ICD-10-CM

## 2023-06-06 DIAGNOSIS — E034 Atrophy of thyroid (acquired): Secondary | ICD-10-CM

## 2023-06-26 ENCOUNTER — Ambulatory Visit (HOSPITAL_COMMUNITY)
Admission: RE | Admit: 2023-06-26 | Discharge: 2023-06-26 | Disposition: A | Discharge status: Home / Self Care | Payer: BC Managed Care – PPO | Source: Ambulatory Visit | Attending: Family Medicine | Admitting: Family Medicine

## 2023-06-26 ENCOUNTER — Encounter (HOSPITAL_COMMUNITY): Payer: Self-pay

## 2023-06-26 DIAGNOSIS — Z1231 Encounter for screening mammogram for malignant neoplasm of breast: Secondary | ICD-10-CM | POA: Insufficient documentation

## 2023-07-08 ENCOUNTER — Ambulatory Visit: Payer: BC Managed Care – PPO | Admitting: Family Medicine

## 2023-07-08 ENCOUNTER — Encounter: Payer: Self-pay | Admitting: Family Medicine

## 2023-07-08 VITALS — BP 116/74 | HR 86 | Ht 65.0 in | Wt 141.0 lb

## 2023-07-08 DIAGNOSIS — E034 Atrophy of thyroid (acquired): Secondary | ICD-10-CM

## 2023-07-08 DIAGNOSIS — F411 Generalized anxiety disorder: Secondary | ICD-10-CM | POA: Diagnosis not present

## 2023-07-08 DIAGNOSIS — F3341 Major depressive disorder, recurrent, in partial remission: Secondary | ICD-10-CM

## 2023-07-08 DIAGNOSIS — G5602 Carpal tunnel syndrome, left upper limb: Secondary | ICD-10-CM

## 2023-07-08 DIAGNOSIS — Z13220 Encounter for screening for lipoid disorders: Secondary | ICD-10-CM

## 2023-07-08 DIAGNOSIS — M654 Radial styloid tenosynovitis [de Quervain]: Secondary | ICD-10-CM

## 2023-07-08 MED ORDER — LEVOTHYROXINE SODIUM 75 MCG PO TABS
75.0000 ug | ORAL_TABLET | Freq: Every day | ORAL | 1 refills | Status: DC
Start: 2023-07-08 — End: 2023-12-04

## 2023-07-08 NOTE — Progress Notes (Signed)
BP 116/74   Pulse 86   Ht 5\' 5"  (1.651 m)   Wt 141 lb (64 kg)   SpO2 100%   BMI 23.46 kg/m    Subjective:   Patient ID: Shari Knox, female    DOB: [birth year], 52 y.o.   MRN: [id number]  HPI: Yamaira Spinner is a 52 y.o. female presenting on 07/08/2023 for Medical Management of Chronic Issues and Hypothyroidism   HPI Hypothyroidism recheck Patient is coming in for thyroid recheck today as well. They deny any issues with hair changes or heat or cold problems or diarrhea or constipation. They deny any chest pain or palpitations. They are currently on levothyroxine 75 micrograms   Anxiety depression recheck Patient is coming in today for anxiety and depression recheck.  Patient is currently taking Cymbalta.  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    07/08/2023    2:28 PM 10/20/2022    8:02 AM 04/17/2022    9:20 AM 05/21/2021   11:55 AM 05/21/2021   11:17 AM  Depression screen PHQ 2/9  Decreased Interest 1 0 0  0  Down, Depressed, Hopeless 1 0 0  0  PHQ - 2 Score 2 0 0  0  Altered sleeping 1 1 0 1   Tired, decreased energy 1 1 1 1    Change in appetite 0 0 0 0   Feeling bad or failure about yourself  0 0 0    Trouble concentrating 0 0 0    Moving slowly or fidgety/restless 0 0 0    Suicidal thoughts 0 0 0    PHQ-9 Score 4 2 1     Difficult doing work/chores Not difficult at all Somewhat difficult Not difficult at all      Patient tolerated the complaint is that sometimes she has noticed that in the morning she wakes up with tingling and a weird sensation in her fingertips and that sometimes it hurts in her wrist as well.  She says it has been going on for few months but not every day.  Relevant past medical, surgical, family and social history reviewed and updated as indicated. Interim medical  history since our last visit reviewed. Allergies and medications reviewed and updated.  Review of Systems  Constitutional:  Negative for chills and fever.  Eyes:  Negative for visual disturbance.  Respiratory:  Negative for chest tightness and shortness of breath.   Cardiovascular:  Negative for chest pain and leg swelling.  Musculoskeletal:  Positive for arthralgias. Negative for back pain and gait problem.  Skin:  Negative for rash.  Neurological:  Negative for dizziness, light-headedness and headaches.  Psychiatric/Behavioral:  Negative for agitation and behavioral problems.   All other systems reviewed and are negative.   Per HPI unless specifically indicated above   Allergies as of 07/08/2023   No Known Allergies      Medication List        Accurate as of July 08, 2023  3:15 PM. If you have any questions, ask your nurse or doctor.          STOP taking these medications    nystatin-triamcinolone ointment Commonly known as: MYCOLOG Stopped by: Elige Radon Laporsche Hoeger       TAKE these medications  acetaminophen 325 MG tablet Commonly known as: TYLENOL Take 650 mg by mouth every 6 (six) hours as needed.   DULoxetine 60 MG capsule Commonly known as: CYMBALTA Take 1 capsule (60 mg total) by mouth daily.   levothyroxine 75 MCG tablet Commonly known as: SYNTHROID Take 1 tablet (75 mcg total) by mouth daily.   naproxen 500 MG tablet Commonly known as: Naprosyn Take 1 tablet (500 mg total) by mouth 2 (two) times daily with a meal.   promethazine-dextromethorphan 6.25-15 MG/5ML syrup Commonly known as: PROMETHAZINE-DM Take 5 mLs by mouth 4 (four) times daily as needed for cough.   valACYclovir 1000 MG tablet Commonly known as: VALTREX TAKE ONE TABLET BY MOUTH TWICE DAILY         Objective:   BP 116/74   Pulse 86   Ht 5\' 5"  (1.651 m)   Wt 141 lb (64 kg)   SpO2 100%   BMI 23.46 kg/m   Wt Readings from Last 3 Encounters:  07/08/23 141 lb (64 kg)   10/20/22 131 lb (59.4 kg)  04/17/22 124 lb (56.2 kg)    Physical Exam Vitals and nursing note reviewed.  Constitutional:      General: She is not in acute distress.    Appearance: She is well-developed. She is not diaphoretic.  Eyes:     Conjunctiva/sclera: Conjunctivae normal.  Cardiovascular:     Rate and Rhythm: Normal rate and regular rhythm.     Heart sounds: Normal heart sounds. No murmur heard. Pulmonary:     Effort: Pulmonary effort is normal. No respiratory distress.     Breath sounds: Normal breath sounds. No wheezing.  Musculoskeletal:        General: No swelling. Normal range of motion.     Left wrist: Tenderness (Lateral tenderness proximal to the base of the thumb) present. No bony tenderness, snuff box tenderness or crepitus. Normal range of motion. Normal pulse.     Comments: Negative Tinel's but based on history sounds possibly like carpal tunnel  Skin:    General: Skin is warm and dry.     Findings: No rash.  Neurological:     Mental Status: She is alert and oriented to person, place, and time.     Coordination: Coordination normal.  Psychiatric:        Behavior: Behavior normal.       Assessment & Plan:   Problem List Items Addressed This Visit       Endocrine   Hypothyroidism   Relevant Medications   levothyroxine (SYNTHROID) 75 MCG tablet   Other Relevant Orders   CBC with Differential/Platelet   CMP14+EGFR   Lipid panel   TSH     Other   Depression   Relevant Orders   CBC with Differential/Platelet   CMP14+EGFR   Lipid panel   TSH   GAD (generalized anxiety disorder) - Primary   Relevant Orders   CBC with Differential/Platelet   CMP14+EGFR   Lipid panel   TSH   Other Visit Diagnoses     Lipid screening       Relevant Orders   Lipid panel   Carpal tunnel syndrome of left wrist       De Quervain's disease (radial styloid tenosynovitis)           Carpal tunnel, likely comes and goes.  Recommended anti-inflammatories and a  nighttime brace and to let us know if she needs injection in the future. Follow up plan: Return in about 6 months (  around 01/06/2024), or if symptoms worsen or fail to improve, for Thyroid recheck.  Counseling provided for all of the vaccine components Orders Placed This Encounter  Procedures   CBC with Differential/Platelet   CMP14+EGFR   Lipid panel   TSH    Arville Care, MD [facility] 07/08/2023, 3:15 PM

## 2023-07-09 LAB — CMP14+EGFR
ALT: 19 IU/L (ref 0–32)
AST: 24 IU/L (ref 0–40)
Albumin: 4.4 g/dL (ref 3.8–4.9)
Alkaline Phosphatase: 75 IU/L (ref 44–121)
BUN/Creatinine Ratio: 20 (ref 9–23)
BUN: 13 mg/dL (ref 6–24)
Bilirubin Total: 0.4 mg/dL (ref 0.0–1.2)
CO2: 25 mmol/L (ref 20–29)
Calcium: 9.4 mg/dL (ref 8.7–10.2)
Chloride: 101 mmol/L (ref 96–106)
Creatinine, Ser: 0.65 mg/dL (ref 0.57–1.00)
Globulin, Total: 3 g/dL (ref 1.5–4.5)
Glucose: 88 mg/dL (ref 70–99)
Potassium: 4.7 mmol/L (ref 3.5–5.2)
Sodium: 139 mmol/L (ref 134–144)
Total Protein: 7.4 g/dL (ref 6.0–8.5)
eGFR: 106 mL/min/{1.73_m2} (ref >59)

## 2023-07-09 LAB — CBC WITH DIFFERENTIAL/PLATELET
Basophils Absolute: 0.1 10*3/uL (ref 0.0–0.2)
Basos: 1 %
EOS (ABSOLUTE): 0.5 10*3/uL — ABNORMAL HIGH (ref 0.0–0.4)
Eos: 7 %
Hematocrit: 40.7 % (ref 34.0–46.6)
Hemoglobin: 13.3 g/dL (ref 11.1–15.9)
Immature Grans (Abs): 0 10*3/uL (ref 0.0–0.1)
Immature Granulocytes: 0 %
Lymphocytes Absolute: 1.7 10*3/uL (ref 0.7–3.1)
Lymphs: 21 %
MCH: 29.7 pg (ref 26.6–33.0)
MCHC: 32.7 g/dL (ref 31.5–35.7)
MCV: 91 fL (ref 79–97)
Monocytes Absolute: 0.6 10*3/uL (ref 0.1–0.9)
Monocytes: 7 %
Neutrophils Absolute: 5.2 10*3/uL (ref 1.4–7.0)
Neutrophils: 64 %
Platelets: 337 10*3/uL (ref 150–450)
RBC: 4.48 x10E6/uL (ref 3.77–5.28)
RDW: 12.6 % (ref 11.7–15.4)
WBC: 8.1 10*3/uL (ref 3.4–10.8)

## 2023-07-09 LAB — LIPID PANEL
Cholesterol, Total: 170 mg/dL (ref 100–199)
HDL: 58 mg/dL (ref >39)
LDL CALC COMMENT:: 2.9 ratio (ref 0.0–4.4)
LDL Chol Calc (NIH): 98 mg/dL (ref 0–99)
Triglycerides: 76 mg/dL (ref 0–149)
VLDL Cholesterol Cal: 14 mg/dL (ref 5–40)

## 2023-07-09 LAB — TSH: TSH: 2.46 u[IU]/mL (ref 0.450–4.500)

## 2023-08-07 ENCOUNTER — Other Ambulatory Visit: Payer: Self-pay | Admitting: Family Medicine

## 2023-08-07 DIAGNOSIS — B009 Herpesviral infection, unspecified: Secondary | ICD-10-CM

## 2023-12-04 ENCOUNTER — Other Ambulatory Visit: Payer: Self-pay | Admitting: Family Medicine

## 2023-12-04 DIAGNOSIS — M797 Fibromyalgia: Secondary | ICD-10-CM

## 2023-12-04 DIAGNOSIS — E034 Atrophy of thyroid (acquired): Secondary | ICD-10-CM

## 2023-12-31 ENCOUNTER — Ambulatory Visit: Payer: BC Managed Care – PPO | Admitting: Family Medicine

## 2023-12-31 ENCOUNTER — Encounter: Payer: Self-pay | Admitting: Family Medicine

## 2023-12-31 VITALS — BP 115/78 | HR 100 | Ht 65.0 in | Wt 148.0 lb

## 2023-12-31 DIAGNOSIS — Z13220 Encounter for screening for lipoid disorders: Secondary | ICD-10-CM

## 2023-12-31 DIAGNOSIS — F3341 Major depressive disorder, recurrent, in partial remission: Secondary | ICD-10-CM

## 2023-12-31 DIAGNOSIS — M797 Fibromyalgia: Secondary | ICD-10-CM

## 2023-12-31 DIAGNOSIS — F411 Generalized anxiety disorder: Secondary | ICD-10-CM | POA: Diagnosis not present

## 2023-12-31 DIAGNOSIS — E034 Atrophy of thyroid (acquired): Secondary | ICD-10-CM

## 2023-12-31 MED ORDER — DULOXETINE HCL 60 MG PO CPEP: 60.0000 mg | ORAL_CAPSULE | Freq: Every day | ORAL | 3 refills | Status: AC

## 2023-12-31 NOTE — Progress Notes (Addendum)
 BP 115/78   Pulse 100   Ht 5\' 5"  (1.651 m)   Wt 148 lb (67.1 kg)   SpO2 97%   BMI 24.63 kg/m    Subjective:   Patient ID: Shari Knox, female    DOB: March 26, 1971, 53 y.o.   MRN: [id number]  HPI: Shari Knox is a 53 y.o. female presenting on 12/31/2023 for Medical Management of Chronic Issues, Hypothyroidism, and Anxiety   HPI Hypothyroidism recheck Patient is coming in for thyroid recheck today as well. They deny any issues with hair changes or heat or cold problems or diarrhea or constipation. They deny any chest pain or palpitations. They are currently on levothyroxine 75 micrograms   Depression and anxiety recheck Patient is coming in today for depression and anxiety recheck and currently takes Cymbalta.  Patient feels like she is doing well for the most part, she still has some days where occasionally she will use an extra 30 mg Cymbalta that she had from the previous dosing but feels like that is not very frequent.  A lot of her current stressors come from work but then also she gets some stressors from watching her daughter have some anxiety issues as well.  She is working on trying to help her daughter get treatment.    12/31/2023    2:42 PM 07/08/2023    2:28 PM 10/20/2022    8:02 AM 04/17/2022    9:20 AM 05/21/2021   11:55 AM  Depression screen PHQ 2/9  Decreased Interest 0 1 0 0   Down, Depressed, Hopeless 0 1 0 0   PHQ - 2 Score 0 2 0 0   Altered sleeping 1 1 1  0 1  Tired, decreased energy 1 1 1 1 1   Change in appetite 0 0 0 0 0  Feeling bad or failure about yourself  0 0 0 0   Trouble concentrating 1 0 0 0   Moving slowly or fidgety/restless 0 0 0 0   Suicidal thoughts 0 0 0 0   PHQ-9 Score 3 4 2 1    Difficult doing work/chores Somewhat difficult Not difficult at all Somewhat difficult Not difficult at all      Relevant past medical, surgical, family and social history reviewed and updated as indicated. Interim medical history since our last visit reviewed. Allergies  and medications reviewed and updated.  Review of Systems  Constitutional:  Negative for chills and fever.  Eyes:  Negative for visual disturbance.  Respiratory:  Negative for chest tightness and shortness of breath.   Cardiovascular:  Negative for chest pain and leg swelling.  Musculoskeletal:  Negative for back pain and gait problem.  Skin:  Negative for rash.  Neurological:  Negative for dizziness, light-headedness and headaches.  Psychiatric/Behavioral:  Positive for dysphoric mood. Negative for agitation, behavioral problems, self-injury, sleep disturbance and suicidal ideas. The patient is nervous/anxious.   All other systems reviewed and are negative.   Per HPI unless specifically indicated above   Allergies as of 12/31/2023   No Known Allergies      Medication List        Accurate as of December 31, 2023  2:57 PM. If you have any questions, ask your nurse or doctor.          STOP taking these medications    promethazine-dextromethorphan 6.25-15 MG/5ML syrup Commonly known as: PROMETHAZINE-DM Stopped by: Elige Radon Dezaree Tracey       TAKE these medications    acetaminophen 325 MG tablet Commonly  known as: TYLENOL Take 650 mg by mouth every 6 (six) hours as needed.   DULoxetine 60 MG capsule Commonly known as: CYMBALTA Take 1 capsule (60 mg total) by mouth daily.   levothyroxine 75 MCG tablet Commonly known as: SYNTHROID TAKE ONE TABLET DAILY   naproxen 500 MG tablet Commonly known as: Naprosyn Take 1 tablet (500 mg total) by mouth 2 (two) times daily with a meal.   valACYclovir 1000 MG tablet Commonly known as: VALTREX TAKE ONE TABLET TWICE DAILY         Objective:   BP 115/78   Pulse 100   Ht 5\' 5"  (1.651 m)   Wt 148 lb (67.1 kg)   SpO2 97%   BMI 24.63 kg/m   Wt Readings from Last 3 Encounters:  12/31/23 148 lb (67.1 kg)  07/08/23 141 lb (64 kg)  10/20/22 131 lb (59.4 kg)    Physical Exam Vitals and nursing note reviewed.   Constitutional:      General: She is not in acute distress.    Appearance: She is well-developed. She is not diaphoretic.  Eyes:     Conjunctiva/sclera: Conjunctivae normal.  Cardiovascular:     Rate and Rhythm: Normal rate and regular rhythm.     Heart sounds: Normal heart sounds. No murmur heard. Pulmonary:     Effort: Pulmonary effort is normal. No respiratory distress.     Breath sounds: Normal breath sounds. No wheezing.  Musculoskeletal:        General: No swelling.  Skin:    General: Skin is warm and dry.     Findings: No rash.  Neurological:     Mental Status: She is alert and oriented to person, place, and time.     Coordination: Coordination normal.  Psychiatric:        Behavior: Behavior normal.       Assessment & Plan:   Problem List Items Addressed This Visit       Endocrine   Hypothyroidism   Relevant Orders   CBC with Differential/Platelet   TSH     Other   Depression - Primary   Relevant Medications   DULoxetine (CYMBALTA) 60 MG capsule   Other Relevant Orders   CBC with Differential/Platelet   CMP14+EGFR   GAD (generalized anxiety disorder)   Relevant Medications   DULoxetine (CYMBALTA) 60 MG capsule   Other Relevant Orders   CBC with Differential/Platelet   CMP14+EGFR   Other Visit Diagnoses       Fibromyalgia       Relevant Medications   DULoxetine (CYMBALTA) 60 MG capsule     Lipid screening       Relevant Orders   Lipid panel     Continue Cymbalta at current dose.  Discussed possibly increasing it permanently rather than just occasionally but she says she is okay with it right now.  Will check blood work today.  Follow up plan: Return in about 6 months (around 07/02/2024), or if symptoms worsen or fail to improve, for Physical exam and hypothyroidism and depression and anxiety.  Counseling provided for all of the vaccine components Orders Placed This Encounter  Procedures   CBC with Differential/Platelet   CMP14+EGFR   Lipid  panel   TSH    Arville Care, MD [facility] 12/31/2023, 2:57 PM

## 2024-01-01 LAB — LIPID PANEL
Chol/HDL Ratio: 3.1 ratio (ref 0.0–4.4)
Cholesterol, Total: 160 mg/dL (ref 100–199)
HDL: 51 mg/dL (ref >39)
LDL Chol Calc (NIH): 89 mg/dL (ref 0–99)
Triglycerides: 110 mg/dL (ref 0–149)
VLDL Cholesterol Cal: 20 mg/dL (ref 5–40)

## 2024-01-01 LAB — CMP14+EGFR
ALT: 18 IU/L (ref 0–32)
AST: 27 IU/L (ref 0–40)
Albumin: 4.3 g/dL (ref 3.8–4.9)
Alkaline Phosphatase: 72 IU/L (ref 44–121)
BUN/Creatinine Ratio: 16 (ref 9–23)
BUN: 10 mg/dL (ref 6–24)
Bilirubin Total: <0.2 mg/dL (ref 0.0–1.2)
CO2: 27 mmol/L (ref 20–29)
Calcium: 9.2 mg/dL (ref 8.7–10.2)
Chloride: 101 mmol/L (ref 96–106)
Creatinine, Ser: 0.64 mg/dL (ref 0.57–1.00)
Globulin, Total: 2.7 g/dL (ref 1.5–4.5)
Glucose: 89 mg/dL (ref 70–99)
Potassium: 4.3 mmol/L (ref 3.5–5.2)
Sodium: 141 mmol/L (ref 134–144)
Total Protein: 7 g/dL (ref 6.0–8.5)
eGFR: 106 mL/min/{1.73_m2} (ref >59)

## 2024-01-01 LAB — TSH: TSH: 2.36 u[IU]/mL (ref 0.450–4.500)

## 2024-01-01 LAB — CBC WITH DIFFERENTIAL/PLATELET
Basophils Absolute: 0.1 10*3/uL (ref 0.0–0.2)
Basos: 1 %
EOS (ABSOLUTE): 0.5 10*3/uL — ABNORMAL HIGH (ref 0.0–0.4)
Eos: 7 %
Hematocrit: 40.3 % (ref 34.0–46.6)
Hemoglobin: 13.3 g/dL (ref 11.1–15.9)
Immature Grans (Abs): 0 10*3/uL (ref 0.0–0.1)
Immature Granulocytes: 0 %
Lymphocytes Absolute: 1.7 10*3/uL (ref 0.7–3.1)
Lymphs: 24 %
MCH: 29.7 pg (ref 26.6–33.0)
MCHC: 33 g/dL (ref 31.5–35.7)
MCV: 90 fL (ref 79–97)
Monocytes Absolute: 0.5 10*3/uL (ref 0.1–0.9)
Monocytes: 8 %
Neutrophils Absolute: 4.2 10*3/uL (ref 1.4–7.0)
Neutrophils: 60 %
Platelets: 323 10*3/uL (ref 150–450)
RBC: 4.48 x10E6/uL (ref 3.77–5.28)
RDW: 12.6 % (ref 11.7–15.4)
WBC: 6.9 10*3/uL (ref 3.4–10.8)

## 2024-01-04 ENCOUNTER — Encounter: Payer: Self-pay | Admitting: Family Medicine

## 2024-06-01 ENCOUNTER — Other Ambulatory Visit (HOSPITAL_COMMUNITY): Payer: Self-pay | Admitting: Family Medicine

## 2024-06-01 DIAGNOSIS — Z1231 Encounter for screening mammogram for malignant neoplasm of breast: Secondary | ICD-10-CM

## 2024-06-27 ENCOUNTER — Ambulatory Visit (HOSPITAL_COMMUNITY)

## 2024-07-06 ENCOUNTER — Encounter: Payer: Self-pay | Admitting: Family Medicine

## 2024-07-06 ENCOUNTER — Ambulatory Visit: Admitting: Family Medicine

## 2024-07-06 ENCOUNTER — Telehealth: Payer: Self-pay | Admitting: Family Medicine

## 2024-07-06 ENCOUNTER — Ambulatory Visit: Payer: Self-pay | Admitting: *Deleted

## 2024-07-06 VITALS — BP 144/99 | HR 115 | Ht 65.0 in | Wt 140.0 lb

## 2024-07-06 DIAGNOSIS — Z0279 Encounter for issue of other medical certificate: Secondary | ICD-10-CM

## 2024-07-06 DIAGNOSIS — F411 Generalized anxiety disorder: Secondary | ICD-10-CM | POA: Diagnosis not present

## 2024-07-06 DIAGNOSIS — F3341 Major depressive disorder, recurrent, in partial remission: Secondary | ICD-10-CM

## 2024-07-06 MED ORDER — DULOXETINE HCL 30 MG PO CPEP: 30.0000 mg | ORAL_CAPSULE | Freq: Every day | ORAL | 3 refills | Status: AC

## 2024-07-06 MED ORDER — HYDROXYZINE PAMOATE 25 MG PO CAPS
25.0000 mg | ORAL_CAPSULE | Freq: Three times a day (TID) | ORAL | 1 refills | Status: DC | PRN
Start: 2024-07-06 — End: 2024-07-29

## 2024-07-06 NOTE — Telephone Encounter (Signed)
 pt dropped off FMLA forms to be completed and signed.  Form Fee Paid? (Y/N)       YES     If NO, form is placed on front office manager desk to hold until payment received. If YES, then form will be placed in the RX/HH Nurse Coordinators box for completion.  Form will not be processed until payment is received

## 2024-07-06 NOTE — Telephone Encounter (Signed)
 FYI Only or Action Required?: FYI only for provider.  Patient was last seen in primary care on 12/31/2023 by Dettinger, Fonda LABOR, MD.  Called Nurse Triage reporting Anxiety.  Symptoms began several months ago.  Interventions attempted:   DULoxetine  (CYMBALTA ) 60 MG capsule  .  Symptoms are: rapidly worsening.  Triage Disposition: See Physician Within 24 Hours  Patient/caregiver understands and will follow disposition?: yes   Reason for Disposition  Patient sounds very upset or troubled to the triager  Answer Assessment - Initial Assessment Questions 1. CONCERN: Did anything happen that prompted you to call today?      Employer has suggested FMLA for patient 2. ANXIETY SYMPTOMS: Can you describe how you (your loved one; patient) have been feeling? (e.g., tense, restless, panicky, anxious, keyed up, overwhelmed, sense of impending doom).      Overwhelmed, out of control 3. ONSET: How long have you been feeling this way? (e.g., hours, days, weeks)     July 28- mother became disabled 4. SEVERITY: How would you rate the level of anxiety? (e.g., 0 - 10; or mild, moderate, severe).     severe 5. FUNCTIONAL IMPAIRMENT: How have these feelings affected your ability to do daily activities? Have you had more difficulty than usual doing your normal daily activities? (e.g., getting better, same, worse; self-care, school, work, Curator)     Struggling with work and care giver 6. HISTORY: Have you felt this way before? Have you ever been diagnosed with an anxiety problem in the past? (e.g., generalized anxiety disorder, panic attacks, PTSD). If Yes, ask: How was this problem treated? (e.g., medicines, counseling, etc.)     no 7. RISK OF HARM - SUICIDAL IDEATION: Do you ever have thoughts of hurting or killing yourself? If Yes, ask:  Do you have these feelings now? Do you have a plan on how you would do this?     No- but she feels she would be better off dead things  are out of control 8. TREATMENT:  What has been done so far to treat this anxiety? (e.g., medicines, relaxation strategies). What has helped?     Patient takes medication for anxiety 9. THERAPIST: Do you have a counselor or therapist? If Yes, ask: What is their name?     In past- does not have one now 10. POTENTIAL TRIGGERS: Do you drink caffeinated beverages (e.g., coffee, colas, teas), and how much daily? Do you drink alcohol or use any drugs? Have you started any new medicines recently?       Patient aware- no new drugs 11. PATIENT SUPPORT: Who is with you now? Who do you live with? Do you have family or friends who you can talk to?        Sister and family that are trying to help her- everyone works 12. OTHER SYMPTOMS: Do you have any other symptoms? (e.g., feeling depressed, trouble concentrating, trouble sleeping, trouble breathing, palpitations or fast heartbeat, chest pain, sweating, nausea, or diarrhea)       Overwhelmed, trouble sleeping, occasional palpitations  Protocols used: Anxiety and Panic Attack-A-AH   Copied from CRM #[id number]. Topic: Clinical - Red Word Triage >> Jul 06, 2024  9:02 AM Diannia H wrote: [facility] that prompted transfer to Nurse Triage: Patient is having a nervous breakdown, she is under a extreme amount of stress at her workplace, she is struggling to keep her job because of the things that's going on with her mother. She is crying now on the phone.

## 2024-07-06 NOTE — Progress Notes (Addendum)
 BP (!) 144/99   Pulse (!) 115   Ht 5' 5 (1.651 m)   Wt 140 lb (63.5 kg)   SpO2 96%   BMI 23.30 kg/m    Subjective:   Patient ID: Shari Knox, female    DOB: 11/21/1970, 53 y.o.   MRN: [id number]  HPI: Shari Knox is a 53 y.o. female presenting on 07/06/2024 for Medical Management of Chronic Issues and Anxiety   Discussed the use of AI scribe software for clinical note transcription with the patient, who gave verbal consent to proceed.  History of Present Illness   Shari Knox is a 53 year old female who presents with overwhelming stress and emotional distress due to caregiving responsibilities and work demands.  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, required hospitalization and rehabilitation, and is now at [employer] for physical therapy since Labor Day - Visits her mother daily after work, resulting in exhaustion and feeling overwhelmed - Feels unsupported and that responsibilities are more than she can handle - Expresses concern about jeopardizing her job and marriage due to her emotional state - Feels guilty about taking time off work and describes herself as someone who 'gets there early and stays late' - Experiences feelings of failure, guilt, and pressure from work changes - Questions her purpose and feels like she is not accomplishing anything - No thoughts of self-harm - Feels 'defeated' and not strong  Sleep disturbance - Difficulty sleeping - Awake since 2 AM on the day of visit  Pharmacologic management of mood symptoms - Takes duloxetine  60 mg daily for several years, previously effective but currently less effective due to increased stress - Previously used hydroxyzine, which was helpful but caused sedation and impaired daytime functioning  Coping strategies -  Regularly talks with her pastor, which is helpful - Currently lacks time for counseling sessions          07/06/2024    2:11 PM 12/31/2023    2:42 PM 07/08/2023    2:28 PM 10/20/2022    8:02 AM 04/17/2022    9:20 AM  Depression screen PHQ 2/9  Decreased Interest 2 0 1 0 0  Down, Depressed, Hopeless 2 0 1 0 0  PHQ - 2 Score 4 0 2 0 0  Altered sleeping 3 1 1 1  0  Tired, decreased energy 2 1 1 1 1   Change in appetite 2 0 0 0 0  Feeling bad or failure about yourself  2 0 0 0 0  Trouble concentrating 2 1 0 0 0  Moving slowly or fidgety/restless 0 0 0 0 0  Suicidal thoughts 1 0 0 0 0  PHQ-9 Score 16 3 4 2 1   Difficult doing work/chores Extremely dIfficult Somewhat difficult Not difficult at all Somewhat difficult Not difficult at all      Relevant past medical, surgical, family and social history reviewed and updated as indicated. Interim medical history since our last visit reviewed. Allergies and medications reviewed and updated.  Review of Systems  Constitutional:  Negative for chills and fever.  Eyes:  Negative for visual disturbance.  Respiratory:  Negative for chest tightness and shortness of breath.   Cardiovascular:  Negative for chest pain and leg swelling.  Musculoskeletal:  Negative for back pain and gait problem.  Skin:  Negative  for rash.  Neurological:  Negative for light-headedness and headaches.  Psychiatric/Behavioral:  Positive for decreased concentration. Negative for agitation, behavioral problems, self-injury, sleep disturbance and suicidal ideas. The patient is nervous/anxious.   All other systems reviewed and are negative.   Per HPI unless specifically indicated above   Allergies as of 07/06/2024   No Known Allergies      Medication List        Accurate as of July 06, 2024  2:45 PM. If you have any questions, ask your nurse or doctor.          acetaminophen 325 MG tablet Commonly known as: TYLENOL Take 650 mg by mouth every 6 (six) hours as  needed.   DULoxetine  60 MG capsule Commonly known as: CYMBALTA  Take 1 capsule (60 mg total) by mouth daily. What changed: Another medication with the same name was added. Make sure you understand how and when to take each. Changed by: Fonda LABOR Shari Knox   DULoxetine  30 MG capsule Commonly known as: Cymbalta  Take 1 capsule (30 mg total) by mouth daily. Take a total of 90 mg What changed: You were already taking a medication with the same name, and this prescription was added. Make sure you understand how and when to take each. Changed by: Fonda LABOR Shari Knox   hydrOXYzine 25 MG capsule Commonly known as: VISTARIL Take 1 capsule (25 mg total) by mouth every 8 (eight) hours as needed. Started by: Fonda LABOR Shari Knox   levothyroxine  75 MCG tablet Commonly known as: SYNTHROID  TAKE ONE TABLET DAILY   naproxen  500 MG tablet Commonly known as: Naprosyn  Take 1 tablet (500 mg total) by mouth 2 (two) times daily with a meal.   valACYclovir  1000 MG tablet Commonly known as: VALTREX  TAKE ONE TABLET TWICE DAILY         Objective:   BP (!) 144/99   Pulse (!) 115   Ht 5' 5 (1.651 m)   Wt 140 lb (63.5 kg)   SpO2 96%   BMI 23.30 kg/m   Wt Readings from Last 3 Encounters:  07/06/24 140 lb (63.5 kg)  12/31/23 148 lb (67.1 kg)  07/08/23 141 lb (64 kg)    Physical Exam Vitals and nursing note reviewed.  Constitutional:      General: She is not in acute distress.    Appearance: She is well-developed. She is not diaphoretic.  Eyes:     Conjunctiva/sclera: Conjunctivae normal.  Cardiovascular:     Rate and Rhythm: Normal rate and regular rhythm.     Heart sounds: Normal heart sounds. No murmur heard. Pulmonary:     Effort: Pulmonary effort is normal. No respiratory distress.     Breath sounds: Normal breath sounds. No wheezing.  Neurological:     Mental Status: She is alert and oriented to person, place, and time.     Coordination: Coordination normal.  Psychiatric:         Behavior: Behavior normal.               Assessment & Plan:   Problem List Items Addressed This Visit       Other   Depression - Primary   Relevant Medications   DULoxetine  (CYMBALTA ) 30 MG capsule   hydrOXYzine (VISTARIL) 25 MG capsule   GAD (generalized anxiety disorder)   Relevant Medications   DULoxetine  (CYMBALTA ) 30 MG capsule   hydrOXYzine (VISTARIL) 25 MG capsule      Major depressive disorder, unspecified Exacerbation of depressive symptoms due to  stressors. Duloxetine  less effective. No suicidal ideation but feelings of purposelessness. - Increase duloxetine  to 90 mg daily by adding 30 mg capsule. - Encourage two weeks off work for medication adjustment and stress management. - Follow-up in three to four weeks to assess treatment response and well-being. - Suggest counseling sessions during time off for support.  Generalized anxiety disorder Heightened anxiety from personal and professional stress. Hydroxyzine effective but sedating. - Prescribe hydroxyzine as needed for acute anxiety, noting sedation risk. - Discuss hydroxyzine use for sleep when overwhelmed.       Will fill out FMLA for the patient and put her out at work for 2 weeks and then after that go to intermittent leave of 1 to 2 days/month with 1 episode per month.  Follow up plan: Return if symptoms worsen or fail to improve, for 3-4 weeks anxiety and depression.  Counseling provided for all of the vaccine components No orders of the defined types were placed in this encounter.   Fonda Levins, MD [facility] 07/06/2024, 2:45 PM

## 2024-07-12 NOTE — Telephone Encounter (Signed)
 [facility] PCP completed and signed FMLA forms. They have been faxed to fax number [phone]. Patient has been contacted and informed they are complete. Copy at front desk.

## 2024-07-13 ENCOUNTER — Ambulatory Visit (HOSPITAL_COMMUNITY)

## 2024-07-29 ENCOUNTER — Ambulatory Visit: Admitting: Family Medicine

## 2024-07-29 ENCOUNTER — Encounter: Payer: Self-pay | Admitting: Family Medicine

## 2024-07-29 VITALS — BP 119/87 | HR 105 | Ht 65.0 in | Wt 137.0 lb

## 2024-07-29 DIAGNOSIS — F3341 Major depressive disorder, recurrent, in partial remission: Secondary | ICD-10-CM

## 2024-07-29 DIAGNOSIS — F411 Generalized anxiety disorder: Secondary | ICD-10-CM

## 2024-07-29 MED ORDER — HYDROXYZINE HCL 10 MG PO TABS
10.0000 mg | ORAL_TABLET | Freq: Three times a day (TID) | ORAL | 1 refills | Status: AC | PRN
Start: 2024-07-29 — End: ?

## 2024-07-29 NOTE — Progress Notes (Signed)
 BP 119/87   Pulse (!) 105   Ht 5' 5 (1.651 m)   Wt 137 lb (62.1 kg)   SpO2 98%   BMI 22.80 kg/m    Subjective:   Patient ID: Shari Knox, female    DOB: June 07, 1971, 53 y.o.   MRN: [id number]  HPI: Shari Knox is a 53 y.o. female presenting on 07/29/2024 for Medical Management of Chronic Issues, Anxiety, and Depression   Discussed the use of AI scribe software for clinical note transcription with the patient, who gave verbal consent to proceed.  History of Present Illness   Shari Knox is a 53 year old female who presents for follow-up of anxiety management.  Anxiety symptoms and management - Improved anxiety symptoms since last visit - Currently taking Cymbalta  90 mg daily, recently increased dose - Cymbalta  provides functional improvement without side effects - Intermittent use of hydroxyzine, approximately two to three times, reserved for particularly stressful days - Hydroxyzine causes significant next-day sedation, described as feeling 'very soggy', limiting its use - No thoughts of self-harm or suicide  Psychophysiological symptoms - Heart rate 'flutters' occur during periods of stress - Heart rate tends to be elevated when anxious  Psychosocial stressors - Significant stress related to managing mother's care, including her mother's recent falls and placement in a facility - Ongoing responsibility for mother's affairs, including life insurance and paperwork, contributing to stress - Intermittent FMLA paperwork in place to accommodate episodes of overwhelming stress, such as delayed work attendance after difficult nights           07/29/2024    9:08 AM 07/06/2024    2:11 PM 12/31/2023    2:42 PM 07/08/2023    2:28 PM 10/20/2022    8:02 AM  Depression screen PHQ 2/9  Decreased Interest 0 2 0 1 0  Down, Depressed, Hopeless 1 2 0 1 0  PHQ - 2 Score 1 4 0 2 0  Altered sleeping 2 3 1 1 1   Tired, decreased energy 2 2 1 1 1   Change in appetite 1 2 0 0 0  Feeling bad or  failure about yourself  1 2 0 0 0  Trouble concentrating 1 2 1  0 0  Moving slowly or fidgety/restless 0 0 0 0 0  Suicidal thoughts 0 1 0 0 0  PHQ-9 Score 8 16 3 4 2   Difficult doing work/chores Very difficult Extremely dIfficult Somewhat difficult Not difficult at all Somewhat difficult         Relevant past medical, surgical, family and social history reviewed and updated as indicated. Interim medical history since our last visit reviewed. Allergies and medications reviewed and updated.  Review of Systems  Constitutional:  Negative for chills and fever.  Eyes:  Negative for visual disturbance.  Respiratory:  Negative for chest tightness and shortness of breath.   Cardiovascular:  Negative for chest pain and leg swelling.  Skin:  Negative for rash.  Neurological:  Negative for dizziness, light-headedness and headaches.  Psychiatric/Behavioral:  Positive for dysphoric mood and sleep disturbance. Negative for agitation, behavioral problems, self-injury and suicidal ideas. The patient is nervous/anxious.   All other systems reviewed and are negative.   Per HPI unless specifically indicated above   Allergies as of 07/29/2024   No Known Allergies      Medication List        Accurate as of July 29, 2024  9:25 AM. If you have any questions, ask your nurse or doctor.  STOP taking these medications    hydrOXYzine 25 MG capsule Commonly known as: VISTARIL Stopped by: Fonda LABOR Harshith Pursell       TAKE these medications    acetaminophen 325 MG tablet Commonly known as: TYLENOL Take 650 mg by mouth every 6 (six) hours as needed.   DULoxetine  60 MG capsule Commonly known as: CYMBALTA  Take 1 capsule (60 mg total) by mouth daily.   DULoxetine  30 MG capsule Commonly known as: Cymbalta  Take 1 capsule (30 mg total) by mouth daily. Take a total of 90 mg   hydrOXYzine 10 MG tablet Commonly known as: ATARAX Take 1 tablet (10 mg total) by mouth 3 (three) times  daily as needed for anxiety. Started by: Fonda LABOR Harue Pribble   levothyroxine  75 MCG tablet Commonly known as: SYNTHROID  TAKE ONE TABLET DAILY   naproxen  500 MG tablet Commonly known as: Naprosyn  Take 1 tablet (500 mg total) by mouth 2 (two) times daily with a meal.   valACYclovir  1000 MG tablet Commonly known as: VALTREX  TAKE ONE TABLET TWICE DAILY         Objective:   BP 119/87   Pulse (!) 105   Ht 5' 5 (1.651 m)   Wt 137 lb (62.1 kg)   SpO2 98%   BMI 22.80 kg/m   Wt Readings from Last 3 Encounters:  07/29/24 137 lb (62.1 kg)  07/06/24 140 lb (63.5 kg)  12/31/23 148 lb (67.1 kg)    Physical Exam Vitals and nursing note reviewed.  Psychiatric:        Mood and Affect: Mood is anxious and depressed.        Thought Content: Thought content does not include suicidal ideation. Thought content does not include suicidal plan.    Physical Exam   NECK: Thyroid  normal. CHEST: Lungs clear to auscultation. CARDIOVASCULAR: Heart sounds normal.         Assessment & Plan:   Problem List Items Addressed This Visit       Other   Depression - Primary   Relevant Medications   hydrOXYzine (ATARAX) 10 MG tablet   GAD (generalized anxiety disorder)   Relevant Medications   hydrOXYzine (ATARAX) 10 MG tablet        Major depressive disorder and generalized anxiety disorder Cymbalta  increased to 90 mg three weeks ago, improving function without side effects. Hydroxyzine causes grogginess. Stress from mother's care contributes to anxiety. Occasional heart rate flutter due to anxiety and stress. No self-harm or suicidal thoughts. - Continue Cymbalta  90 mg daily. - Prescribe hydroxyzine 10 mg capsules as needed for anxiety, in addition to 25 mg capsules for severe episodes. - Schedule follow-up in one month.          Follow up plan: Return if symptoms worsen or fail to improve, for 1 to 60-month anxiety recheck.  Counseling provided for all of the vaccine  components No orders of the defined types were placed in this encounter.   Fonda Levins, MD [facility] 07/29/2024, 9:25 AM

## 2024-08-10 ENCOUNTER — Telehealth: Payer: Self-pay | Admitting: Family Medicine

## 2024-08-10 NOTE — Telephone Encounter (Signed)
 Spoke with pt forms being emailed, will call pt when we get them

## 2024-08-10 NOTE — Telephone Encounter (Signed)
 Copied from CRM [id number]. Topic: General - Other >> Aug 10, 2024  8:40 AM Miquel SAILOR wrote: Reason for CRM: Patient came in on 10/01 and 10/24 PT dicussed on paperwork for Intermittent Leave. Pt needs additional paperwork filled out. Needs call back on how to get that to office to send to her work. [phone]

## 2024-08-17 ENCOUNTER — Ambulatory Visit (HOSPITAL_COMMUNITY)
Admission: RE | Admit: 2024-08-17 | Discharge: 2024-08-17 | Disposition: A | Discharge status: Home / Self Care | Source: Ambulatory Visit | Attending: Family Medicine | Admitting: Family Medicine

## 2024-08-17 ENCOUNTER — Encounter (HOSPITAL_COMMUNITY): Payer: Self-pay

## 2024-08-17 DIAGNOSIS — Z1231 Encounter for screening mammogram for malignant neoplasm of breast: Secondary | ICD-10-CM | POA: Insufficient documentation

## 2024-08-17 NOTE — Telephone Encounter (Signed)
 Aware form ready will be at front desk

## 2024-08-26 ENCOUNTER — Ambulatory Visit: Payer: Self-pay | Admitting: Family Medicine

## 2024-08-26 ENCOUNTER — Encounter: Payer: Self-pay | Admitting: Family Medicine

## 2024-08-26 VITALS — BP 108/64 | HR 101 | Temp 98.1°F | Ht 65.0 in | Wt 136.2 lb

## 2024-08-26 DIAGNOSIS — E034 Atrophy of thyroid (acquired): Secondary | ICD-10-CM | POA: Diagnosis not present

## 2024-08-26 DIAGNOSIS — F411 Generalized anxiety disorder: Secondary | ICD-10-CM

## 2024-08-26 DIAGNOSIS — F3341 Major depressive disorder, recurrent, in partial remission: Secondary | ICD-10-CM | POA: Diagnosis not present

## 2024-08-26 LAB — LIPID PANEL

## 2024-08-26 NOTE — Progress Notes (Signed)
 BP 108/64   Pulse (!) 101   Temp 98.1 F (36.7 C)   Ht 5' 5 (1.651 m)   Wt 136 lb 3.2 oz (61.8 kg)   SpO2 96%   BMI 22.66 kg/m    Subjective:   Patient ID: Shari Knox, female    DOB: 22-Mar-1971, 53 y.o.   MRN: [id number]  HPI: Shari Knox is a 53 y.o. female presenting on 08/26/2024 for Follow-up   Discussed the use of AI scribe software for clinical note transcription with the patient, who gave verbal consent to proceed.  History of Present Illness   Shari Knox is a 53 year old female who presents for a recheck of her anxiety and sleep issues.  Anxiety and mood disturbance - Ongoing anxiety with episodes of feeling overwhelmed, anxious, or agitated, though these episodes are less frequent than previously - Improved sense of control over anxiety and ability to defer stressors for later management - Takes Cymbalta  90 mg daily, which provides benefit for anxiety symptoms  Sleep disturbance - Persistent sleep disturbances - Uses hydroxyzine  10 mg, quartered nightly, to aid sleep without causing grogginess - A half tablet of hydroxyzine  results in grogginess  Thyroid  dysfunction - History of thyroid  issues - Takes levothyroxine  daily          Relevant past medical, surgical, family and social history reviewed and updated as indicated. Interim medical history since our last visit reviewed. Allergies and medications reviewed and updated.  Review of Systems  Constitutional:  Negative for chills and fever.  Eyes:  Negative for visual disturbance.  Respiratory:  Negative for chest tightness and shortness of breath.   Cardiovascular:  Negative for chest pain and leg swelling.  Genitourinary:  Negative for difficulty urinating and dysuria.  Musculoskeletal:  Negative for back pain and gait problem.  Skin:  Negative for rash.  Neurological:  Negative for light-headedness and headaches.  Psychiatric/Behavioral:  Positive for dysphoric mood and sleep disturbance. Negative for  agitation, behavioral problems, self-injury and suicidal ideas. The patient is nervous/anxious.   All other systems reviewed and are negative.   Per HPI unless specifically indicated above   Allergies as of 08/26/2024   No Known Allergies      Medication List        Accurate as of August 26, 2024  8:58 AM. If you have any questions, ask your nurse or doctor.          acetaminophen 325 MG tablet Commonly known as: TYLENOL Take 650 mg by mouth every 6 (six) hours as needed.   DULoxetine  60 MG capsule Commonly known as: CYMBALTA  Take 1 capsule (60 mg total) by mouth daily.   DULoxetine  30 MG capsule Commonly known as: Cymbalta  Take 1 capsule (30 mg total) by mouth daily. Take a total of 90 mg   hydrOXYzine  10 MG tablet Commonly known as: ATARAX  Take 1 tablet (10 mg total) by mouth 3 (three) times daily as needed for anxiety.   levothyroxine  75 MCG tablet Commonly known as: SYNTHROID  TAKE ONE TABLET DAILY   naproxen  500 MG tablet Commonly known as: Naprosyn  Take 1 tablet (500 mg total) by mouth 2 (two) times daily with a meal.   valACYclovir  1000 MG tablet Commonly known as: VALTREX  TAKE ONE TABLET TWICE DAILY         Objective:   BP 108/64   Pulse (!) 101   Temp 98.1 F (36.7 C)   Ht 5' 5 (1.651 m)   Wt 136 lb  3.2 oz (61.8 kg)   SpO2 96%   BMI 22.66 kg/m   Wt Readings from Last 3 Encounters:  08/26/24 136 lb 3.2 oz (61.8 kg)  07/29/24 137 lb (62.1 kg)  07/06/24 140 lb (63.5 kg)    Physical Exam Physical Exam   CHEST: Lungs clear to auscultation bilaterally. CARDIOVASCULAR: Heart regular rate and rhythm. No cardiac masses or abnormalities on palpation.         Assessment & Plan:   Problem List Items Addressed This Visit       Endocrine   Hypothyroidism   Relevant Orders   CBC With Diff/Platelet   CMP14+EGFR   Lipid panel   TSH     Other   Depression - Primary   Relevant Orders   CBC With Diff/Platelet   CMP14+EGFR   Lipid  panel   TSH   GAD (generalized anxiety disorder)   Relevant Orders   CBC With Diff/Platelet   CMP14+EGFR   Lipid panel   TSH       Depression and generalized anxiety disorder Managed with hydroxyzine  and Cymbalta . Hydroxyzine  aids sleep without grogginess. Cymbalta  90 mg reduces anxiety episodes, which are decreasing in frequency. She is improving in stress management. - Continue hydroxyzine  10 mg nightly. - Continue Cymbalta  90 mg daily.  Acquired hypothyroidism Managed with levothyroxine . No issues reported. Last thyroid  check was seven months ago. - Order thyroid  function tests. - Continue levothyroxine  daily.          Follow up plan: Return if symptoms worsen or fail to improve, for 2 to 61-month anxiety depression.  Counseling provided for all of the vaccine components Orders Placed This Encounter  Procedures   CBC With Diff/Platelet   CMP14+EGFR   Lipid panel   TSH    Fonda Levins, MD [facility] 08/26/2024, 8:58 AM

## 2024-08-27 LAB — CBC WITH DIFF/PLATELET
Basophils Absolute: 0.1 x10E3/uL (ref 0.0–0.2)
Basos: 1 %
EOS (ABSOLUTE): 0.4 x10E3/uL (ref 0.0–0.4)
Eos: 6 %
Hematocrit: 42.8 % (ref 34.0–46.6)
Hemoglobin: 13.7 g/dL (ref 11.1–15.9)
Immature Grans (Abs): 0 x10E3/uL (ref 0.0–0.1)
Immature Granulocytes: 0 %
Lymphocytes Absolute: 1.2 x10E3/uL (ref 0.7–3.1)
Lymphs: 20 %
MCH: 30.1 pg (ref 26.6–33.0)
MCHC: 32 g/dL (ref 31.5–35.7)
MCV: 94 fL (ref 79–97)
Monocytes Absolute: 0.4 x10E3/uL (ref 0.1–0.9)
Monocytes: 7 %
Neutrophils Absolute: 4 x10E3/uL (ref 1.4–7.0)
Neutrophils: 66 %
Platelets: 365 x10E3/uL (ref 150–450)
RBC: 4.55 x10E6/uL (ref 3.77–5.28)
RDW: 13.2 % (ref 11.7–15.4)
WBC: 6 x10E3/uL (ref 3.4–10.8)

## 2024-08-27 LAB — CMP14+EGFR
ALT: 16 IU/L (ref 0–32)
AST: 21 IU/L (ref 0–40)
Albumin: 4.2 g/dL (ref 3.8–4.9)
Alkaline Phosphatase: 76 IU/L (ref 49–135)
BUN/Creatinine Ratio: 14 (ref 9–23)
BUN: 10 mg/dL (ref 6–24)
Bilirubin Total: 0.3 mg/dL (ref 0.0–1.2)
CO2: 24 mmol/L (ref 20–29)
Calcium: 9.6 mg/dL (ref 8.7–10.2)
Chloride: 101 mmol/L (ref 96–106)
Creatinine, Ser: 0.7 mg/dL (ref 0.57–1.00)
Globulin, Total: 2.8 g/dL (ref 1.5–4.5)
Glucose: 72 mg/dL (ref 70–99)
Potassium: 4.4 mmol/L (ref 3.5–5.2)
Sodium: 139 mmol/L (ref 134–144)
Total Protein: 7 g/dL (ref 6.0–8.5)
eGFR: 103 mL/min/1.73 (ref >59)

## 2024-08-27 LAB — LIPID PANEL
Cholesterol, Total: 162 mg/dL (ref 100–199)
HDL: 52 mg/dL (ref >39)
LDL CALC COMMENT:: 3.1 ratio (ref 0.0–4.4)
LDL Chol Calc (NIH): 95 mg/dL (ref 0–99)
Triglycerides: 82 mg/dL (ref 0–149)
VLDL Cholesterol Cal: 15 mg/dL (ref 5–40)

## 2024-08-27 LAB — TSH: TSH: 2.43 u[IU]/mL (ref 0.450–4.500)

## 2024-08-31 ENCOUNTER — Ambulatory Visit: Payer: Self-pay | Admitting: Family Medicine

## 2024-10-18 ENCOUNTER — Other Ambulatory Visit: Payer: Self-pay | Admitting: Family Medicine

## 2024-10-18 DIAGNOSIS — B009 Herpesviral infection, unspecified: Secondary | ICD-10-CM

## 2024-10-26 ENCOUNTER — Encounter: Payer: Self-pay | Admitting: Family Medicine

## 2024-10-26 ENCOUNTER — Ambulatory Visit: Admitting: Family Medicine

## 2024-10-26 VITALS — BP 120/79 | HR 95 | Ht 65.0 in | Wt 140.0 lb

## 2024-10-26 DIAGNOSIS — F411 Generalized anxiety disorder: Secondary | ICD-10-CM

## 2024-10-26 DIAGNOSIS — F330 Major depressive disorder, recurrent, mild: Secondary | ICD-10-CM | POA: Diagnosis not present

## 2024-10-26 NOTE — Progress Notes (Signed)
 "  BP 120/79   Pulse 95   Ht 5' 5 (1.651 m)   Wt 140 lb (63.5 kg)   SpO2 96%   BMI 23.30 kg/m    Subjective:   Patient ID: Shari Knox, female    DOB: 08-Mar-1971, 54 y.o.   MRN: [id number]  HPI: Shari Knox is a 54 y.o. female presenting on 10/26/2024 for Medical Management of Chronic Issues, Anxiety, Depression, and Hypothyroidism   Discussed the use of AI scribe software for clinical note transcription with the patient, who gave verbal consent to proceed.  History of Present Illness   Shari Knox is a 54 year old female with anxiety and depression who presents for a recheck.  Psychiatric symptoms - Anxiety and depression exacerbated by work-related stress. - Workload is demanding due to senior employee responsibilities. - Uses hydroxyzine  as needed; even a quarter tablet causes significant drowsiness. - Has taken breaks from work approximately three times since November due to feeling overwhelmed. - Currently taking Cymbalta , alternating between 60 mg and 90 mg depending on stress levels. - This past week, has been taking 90 mg due to increased work pressure. - Reduces Cymbalta  dose to 60 mg during less stressful weeks and manages well. - No thoughts of self-harm or suicide.  Oropharyngeal sensation - Sensation of something getting stuck in throat when swallowing. - Recently recovered from a cold, which may be contributing to this sensation.           10/26/2024    3:36 PM 08/26/2024    9:00 AM 07/29/2024    9:08 AM 07/06/2024    2:11 PM 12/31/2023    2:42 PM  Depression screen PHQ 2/9  Decreased Interest 1 1 0 2 0  Down, Depressed, Hopeless 1 1 1 2  0  PHQ - 2 Score 2 2 1 4  0  Altered sleeping 1 2 2 3 1   Tired, decreased energy 1 1 2 2 1   Change in appetite 0 0 1 2 0  Feeling bad or failure about yourself  0 1 1 2  0  Trouble concentrating 1 1 1 2 1   Moving slowly or fidgety/restless 0 0 0 0 0  Suicidal thoughts 0 0 0 1 0  PHQ-9 Score 5 7 8  16  3    Difficult doing  work/chores Somewhat difficult Somewhat difficult Very difficult Extremely dIfficult Somewhat difficult     Data saved with a previous flowsheet row definition      Relevant past medical, surgical, family and social history reviewed and updated as indicated. Interim medical history since our last visit reviewed. Allergies and medications reviewed and updated.  Review of Systems  Constitutional:  Negative for fever.  HENT:  Negative for congestion, ear discharge and ear pain.   Eyes:  Negative for redness and visual disturbance.  Respiratory:  Negative for chest tightness and shortness of breath.   Cardiovascular:  Negative for chest pain and leg swelling.  Genitourinary:  Negative for difficulty urinating and dysuria.  Skin:  Negative for rash.  Neurological:  Negative for dizziness, light-headedness and headaches.  Psychiatric/Behavioral:  Positive for dysphoric mood and sleep disturbance. Negative for agitation, behavioral problems, self-injury and suicidal ideas. The patient is nervous/anxious.   All other systems reviewed and are negative.   Per HPI unless specifically indicated above   Allergies as of 10/26/2024   No Known Allergies      Medication List        Accurate as of October 26, 2024  3:53 PM. If you have any questions, ask your nurse or doctor.          acetaminophen 325 MG tablet Commonly known as: TYLENOL Take 650 mg by mouth every 6 (six) hours as needed.   DULoxetine  60 MG capsule Commonly known as: CYMBALTA  Take 1 capsule (60 mg total) by mouth daily.   DULoxetine  30 MG capsule Commonly known as: Cymbalta  Take 1 capsule (30 mg total) by mouth daily. Take a total of 90 mg   hydrOXYzine  10 MG tablet Commonly known as: ATARAX  Take 1 tablet (10 mg total) by mouth 3 (three) times daily as needed for anxiety.   levothyroxine  75 MCG tablet Commonly known as: SYNTHROID  TAKE ONE TABLET DAILY   naproxen  500 MG tablet Commonly known as:  Naprosyn  Take 1 tablet (500 mg total) by mouth 2 (two) times daily with a meal.   valACYclovir  1000 MG tablet Commonly known as: VALTREX  TAKE ONE TABLET TWICE DAILY         Objective:   BP 120/79   Pulse 95   Ht 5' 5 (1.651 m)   Wt 140 lb (63.5 kg)   SpO2 96%   BMI 23.30 kg/m   Wt Readings from Last 3 Encounters:  10/26/24 140 lb (63.5 kg)  08/26/24 136 lb 3.2 oz (61.8 kg)  07/29/24 137 lb (62.1 kg)    Physical Exam Vitals and nursing note reviewed.  Cardiovascular:     Rate and Rhythm: Normal rate and regular rhythm.     Heart sounds: No murmur heard.   Physical Exam   NECK: Thyroid  normal, no masses. CHEST: Lungs clear to auscultation.         Assessment & Plan:   Problem List Items Addressed This Visit       Other   Depression - Primary   GAD (generalized anxiety disorder)       Recurrent major depressive disorder and generalized anxiety disorder Increased stress and anxiety due to work and personal pressures. Prefers not to increase Cymbalta  dosage. No suicidal ideation. - Continue Cymbalta  90 mg as needed, option to reduce to 60 mg during less stressful periods. - Continue hydroxyzine  as needed for acute anxiety. - Maintain intermittent leave at work for anxiety and depression as needed.  Dysphagia likely secondary to gastroesophageal reflux disease Sensation of something stuck in throat likely due to GERD or postnasal drainage. Recent cold may contribute. - Trial Pepcid AC for two weeks if symptoms consistent with reflux. - Consider Flonase or Claritin if symptoms consistent with sinus drainage. - Report if symptoms persist despite treatment.          Follow up plan: Return in about 3 months (around 01/24/2025), or if symptoms worsen or fail to improve, for Thyroid  and anxiety and depression recheck.  Counseling provided for all of the vaccine components No orders of the defined types were placed in this encounter.   Fonda Levins,  MD [facility] 10/26/2024, 3:53 PM     "

## 2025-01-25 ENCOUNTER — Ambulatory Visit: Admitting: Family Medicine
